# Patient Record
Sex: Male | Born: 1965 | Race: White | Hispanic: No | Marital: Married | Smoking: Current every day smoker
Health system: Southern US, Community
[De-identification: ages and names within clinical notes are randomized; demographics above are authoritative.]

## PROBLEM LIST (undated history)

## (undated) DIAGNOSIS — E785 Hyperlipidemia, unspecified: Secondary | ICD-10-CM

## (undated) DIAGNOSIS — Z87898 Personal history of other specified conditions: Secondary | ICD-10-CM

## (undated) DIAGNOSIS — N529 Male erectile dysfunction, unspecified: Secondary | ICD-10-CM

## (undated) DIAGNOSIS — E7211 Homocystinuria: Secondary | ICD-10-CM

## (undated) DIAGNOSIS — H5702 Anisocoria: Secondary | ICD-10-CM

## (undated) DIAGNOSIS — D3A00 Benign carcinoid tumor of unspecified site: Secondary | ICD-10-CM

## (undated) DIAGNOSIS — I10 Essential (primary) hypertension: Secondary | ICD-10-CM

## (undated) DIAGNOSIS — I4510 Unspecified right bundle-branch block: Secondary | ICD-10-CM

## (undated) DIAGNOSIS — K219 Gastro-esophageal reflux disease without esophagitis: Secondary | ICD-10-CM

## (undated) DIAGNOSIS — J988 Other specified respiratory disorders: Secondary | ICD-10-CM

## (undated) DIAGNOSIS — R7989 Other specified abnormal findings of blood chemistry: Secondary | ICD-10-CM

## (undated) HISTORY — PX: OTHER SURGICAL HISTORY: SHX169

## (undated) HISTORY — DX: Hyperlipidemia, unspecified: E78.5

## (undated) HISTORY — DX: Other specified abnormal findings of blood chemistry: R79.89

## (undated) HISTORY — PX: INGUINAL HERNIA REPAIR: SUR1180

## (undated) HISTORY — DX: Gastro-esophageal reflux disease without esophagitis: K21.9

## (undated) HISTORY — DX: Essential (primary) hypertension: I10

## (undated) HISTORY — DX: Male erectile dysfunction, unspecified: N52.9

## (undated) HISTORY — DX: Other specified respiratory disorders: J98.8

## (undated) HISTORY — DX: Benign carcinoid tumor of unspecified site: D3A.00

## (undated) HISTORY — DX: Unspecified right bundle-branch block: I45.10

## (undated) HISTORY — DX: Personal history of other specified conditions: Z87.898

## (undated) HISTORY — DX: Homocystinuria: E72.11

## (undated) HISTORY — DX: Anisocoria: H57.02

---

## 2001-09-30 DIAGNOSIS — D3A00 Benign carcinoid tumor of unspecified site: Secondary | ICD-10-CM

## 2001-09-30 HISTORY — PX: OTHER SURGICAL HISTORY: SHX169

## 2001-09-30 HISTORY — DX: Benign carcinoid tumor of unspecified site: D3A.00

## 2002-07-16 ENCOUNTER — Encounter: Payer: Self-pay | Admitting: Internal Medicine

## 2002-07-16 ENCOUNTER — Encounter: Admission: RE | Admit: 2002-07-16 | Discharge: 2002-07-16 | Payer: Self-pay | Admitting: Internal Medicine

## 2002-07-18 ENCOUNTER — Encounter: Payer: Self-pay | Admitting: Family Medicine

## 2002-07-18 ENCOUNTER — Encounter: Admission: RE | Admit: 2002-07-18 | Discharge: 2002-07-18 | Payer: Self-pay | Admitting: Family Medicine

## 2003-01-17 ENCOUNTER — Encounter: Admission: RE | Admit: 2003-01-17 | Discharge: 2003-01-17 | Payer: Self-pay | Admitting: Internal Medicine

## 2003-01-17 ENCOUNTER — Encounter: Payer: Self-pay | Admitting: Internal Medicine

## 2003-07-05 ENCOUNTER — Encounter: Payer: Self-pay | Admitting: Internal Medicine

## 2003-07-05 ENCOUNTER — Encounter: Admission: RE | Admit: 2003-07-05 | Discharge: 2003-07-05 | Payer: Self-pay | Admitting: Internal Medicine

## 2004-08-07 ENCOUNTER — Ambulatory Visit: Payer: Self-pay | Admitting: Internal Medicine

## 2004-09-17 ENCOUNTER — Ambulatory Visit: Payer: Self-pay | Admitting: Internal Medicine

## 2004-09-17 ENCOUNTER — Observation Stay (HOSPITAL_COMMUNITY): Admission: EM | Admit: 2004-09-17 | Discharge: 2004-09-18 | Payer: Self-pay | Admitting: Emergency Medicine

## 2004-09-26 ENCOUNTER — Ambulatory Visit: Payer: Self-pay

## 2004-10-11 ENCOUNTER — Ambulatory Visit: Payer: Self-pay

## 2004-10-19 ENCOUNTER — Ambulatory Visit: Payer: Self-pay | Admitting: Internal Medicine

## 2005-01-04 ENCOUNTER — Ambulatory Visit: Payer: Self-pay | Admitting: Internal Medicine

## 2005-07-01 ENCOUNTER — Ambulatory Visit: Payer: Self-pay | Admitting: Internal Medicine

## 2005-07-19 ENCOUNTER — Ambulatory Visit: Payer: Self-pay | Admitting: Internal Medicine

## 2005-09-30 DIAGNOSIS — H5702 Anisocoria: Secondary | ICD-10-CM

## 2005-09-30 HISTORY — DX: Anisocoria: H57.02

## 2005-12-18 ENCOUNTER — Ambulatory Visit: Payer: Self-pay | Admitting: Internal Medicine

## 2006-01-13 ENCOUNTER — Ambulatory Visit: Payer: Self-pay | Admitting: Internal Medicine

## 2006-03-27 ENCOUNTER — Ambulatory Visit: Payer: Self-pay | Admitting: Internal Medicine

## 2006-04-25 ENCOUNTER — Ambulatory Visit: Payer: Self-pay | Admitting: Internal Medicine

## 2006-05-07 ENCOUNTER — Ambulatory Visit: Payer: Self-pay | Admitting: Internal Medicine

## 2006-07-15 ENCOUNTER — Ambulatory Visit: Payer: Self-pay | Admitting: Internal Medicine

## 2006-07-23 ENCOUNTER — Ambulatory Visit: Payer: Self-pay | Admitting: Internal Medicine

## 2006-08-06 ENCOUNTER — Ambulatory Visit: Payer: Self-pay | Admitting: Internal Medicine

## 2006-09-10 ENCOUNTER — Ambulatory Visit: Payer: Self-pay | Admitting: Internal Medicine

## 2006-09-10 LAB — CONVERTED CEMR LAB
ALT: 28 units/L (ref 0–40)
AST: 24 units/L (ref 0–37)
Chol/HDL Ratio, serum: 2.9
Cholesterol: 176 mg/dL (ref 0–200)
HDL: 60.7 mg/dL (ref >39.0)
LDL Cholesterol: 106 mg/dL — ABNORMAL HIGH (ref 0–99)
Triglyceride fasting, serum: 49 mg/dL (ref 0–149)
VLDL: 10 mg/dL (ref 0–40)

## 2006-10-03 ENCOUNTER — Ambulatory Visit: Payer: Self-pay | Admitting: Internal Medicine

## 2006-11-04 DIAGNOSIS — J45909 Unspecified asthma, uncomplicated: Secondary | ICD-10-CM

## 2007-12-14 ENCOUNTER — Ambulatory Visit: Payer: Self-pay | Admitting: Internal Medicine

## 2007-12-14 DIAGNOSIS — K219 Gastro-esophageal reflux disease without esophagitis: Secondary | ICD-10-CM

## 2007-12-14 DIAGNOSIS — E785 Hyperlipidemia, unspecified: Secondary | ICD-10-CM

## 2007-12-14 DIAGNOSIS — M5430 Sciatica, unspecified side: Secondary | ICD-10-CM

## 2008-02-24 ENCOUNTER — Encounter (INDEPENDENT_AMBULATORY_CARE_PROVIDER_SITE_OTHER): Payer: Self-pay | Admitting: *Deleted

## 2008-02-24 ENCOUNTER — Ambulatory Visit: Payer: Self-pay | Admitting: Internal Medicine

## 2008-10-27 ENCOUNTER — Ambulatory Visit: Payer: Self-pay | Admitting: Internal Medicine

## 2008-10-27 DIAGNOSIS — F528 Other sexual dysfunction not due to a substance or known physiological condition: Secondary | ICD-10-CM | POA: Insufficient documentation

## 2008-10-27 DIAGNOSIS — R079 Chest pain, unspecified: Secondary | ICD-10-CM

## 2008-10-27 DIAGNOSIS — F329 Major depressive disorder, single episode, unspecified: Secondary | ICD-10-CM

## 2008-10-27 DIAGNOSIS — D3A025 Benign carcinoid tumor of the sigmoid colon: Secondary | ICD-10-CM

## 2008-10-28 ENCOUNTER — Encounter: Payer: Self-pay | Admitting: Internal Medicine

## 2008-11-25 ENCOUNTER — Ambulatory Visit: Payer: Self-pay | Admitting: Internal Medicine

## 2008-11-29 ENCOUNTER — Encounter (INDEPENDENT_AMBULATORY_CARE_PROVIDER_SITE_OTHER): Payer: Self-pay | Admitting: *Deleted

## 2008-12-05 LAB — CONVERTED CEMR LAB
BUN: 12 mg/dL (ref 6–23)
Creatinine, Ser: 0.9 mg/dL (ref 0.4–1.5)
Hgb A1c MFr Bld: 5.3 % (ref 4.6–6.0)
Potassium: 3.9 meq/L (ref 3.5–5.1)
TSH: 2.19 microintl units/mL (ref 0.35–5.50)
Testosterone: 271.32 ng/dL — ABNORMAL LOW (ref 350.00–890.00)

## 2008-12-06 ENCOUNTER — Encounter (INDEPENDENT_AMBULATORY_CARE_PROVIDER_SITE_OTHER): Payer: Self-pay | Admitting: *Deleted

## 2009-02-21 ENCOUNTER — Telehealth (INDEPENDENT_AMBULATORY_CARE_PROVIDER_SITE_OTHER): Payer: Self-pay | Admitting: *Deleted

## 2009-04-24 ENCOUNTER — Ambulatory Visit: Payer: Self-pay | Admitting: Internal Medicine

## 2009-04-24 DIAGNOSIS — E291 Testicular hypofunction: Secondary | ICD-10-CM

## 2009-05-18 ENCOUNTER — Telehealth (INDEPENDENT_AMBULATORY_CARE_PROVIDER_SITE_OTHER): Payer: Self-pay | Admitting: *Deleted

## 2009-10-24 ENCOUNTER — Ambulatory Visit: Payer: Self-pay | Admitting: Internal Medicine

## 2009-10-24 DIAGNOSIS — I10 Essential (primary) hypertension: Secondary | ICD-10-CM | POA: Insufficient documentation

## 2009-10-24 DIAGNOSIS — R21 Rash and other nonspecific skin eruption: Secondary | ICD-10-CM

## 2009-11-28 ENCOUNTER — Telehealth: Payer: Self-pay | Admitting: Internal Medicine

## 2010-05-08 ENCOUNTER — Inpatient Hospital Stay (HOSPITAL_COMMUNITY): Admission: EM | Admit: 2010-05-08 | Discharge: 2010-05-10 | Payer: Self-pay | Admitting: Emergency Medicine

## 2010-05-10 ENCOUNTER — Encounter: Payer: Self-pay | Admitting: Internal Medicine

## 2010-09-25 ENCOUNTER — Telehealth: Payer: Self-pay | Admitting: Internal Medicine

## 2010-10-30 NOTE — Letter (Signed)
Summary: Encounter Notice/[facility]  Encounter Notice/[facility]   Imported By: Lanelle Bal 05/17/2010 09:29:59  _____________________________________________________________________  External Attachment:    Type:   Image     Comment:   External Document

## 2010-10-30 NOTE — Assessment & Plan Note (Signed)
Summary: chicken pox/kdc   Vital Signs:  Patient profile:   45 year old Morrow Weight:      211.6 pounds Temp:     98.1 degrees F oral Pulse rate:   76 / minute Resp:     15 per minute BP sitting:   160 / 88  (left arm) Cuff size:   large  Vitals Entered By: Shonna Chock (October 24, 2009 2:03 PM) CC: 1.) Blisters- one on left hand, one in head   2.)Discuss Lisinopril-patient not taking, ? if he ever took  3.) Orders for fasting labs-near future Comments REVIEWED MED LIST, PATIENT AGREED DOSE AND INSTRUCTION CORRECT    CC:  1.) Blisters- one on left hand, one in head   2.)Discuss Lisinopril-patient not taking, and ? if he ever took  3.) Orders for fasting labs-near future.  History of Present Illness: 2 asymptomatic  blisters as 10/20/2009 noted while typing; no tauma, chemical exposure or burning.Resolving w/o treatment except topical neosporin. No PMH of herpes or chickenpox.See BP ; BP not checked @ home. He has not been on Lisinopril as listed .He is on CCB.  Allergies (verified): No Known Drug Allergies  Review of Systems General:  Complains of chills; denies fever, sweats, and weight loss. ENT:  Denies sore throat; No oral lesions. GU:  Denies discharge, dysuria, genital sores, and hematuria. MS:  Complains of joint pain; R ankle pain ,? gout. Derm:  Complains of lesion(s); denies itching.  Physical Exam  General:  well-nourished,in no acute distress; alert,appropriate and cooperative throughout examination Mouth:  Oral mucosa and oropharynx without lesions or exudates.  Teeth in good repair. Lungs:  Normal respiratory effort, chest expands symmetrically. Lungs are clear to auscultation, no crackles or wheezes. Heart:  normal rate, regular rhythm, no gallop, no rub, no JVD, no HJR, and grade 1.5 /6 systolic murmur.   Abdomen:  Bowel sounds positive,abdomen soft and non-tender without masses, organomegaly or hernias noted. Genitalia:   No penis lesions or urethral  discharge. Skin:  2 vesicles L haand 5X5 mm & 7X7 mm  Cervical Nodes:  No lymphadenopathy noted Axillary Nodes:  No palpable lymphadenopathy; no epitrochlear LA   Impression & Recommendations:  Problem # 1:  SKIN RASH (ICD-782.1) burn suggested ; resolving   Problem # 2:  HYPERTENSION (ICD-401.9)  His updated medication list for this problem includes:    Cardizem La 240 Mg Tb24 (Diltiazem hcl coated beads) .Marland Kitchen... Take 1 tablet by mouth once a day    Lisinopril 10 Mg Tabs (Lisinopril) .Marland Kitchen... 1 once daily  Complete Medication List: 1)  Cardizem La 240 Mg Tb24 (Diltiazem hcl coated beads) .... Take 1 tablet by mouth once a day 2)  Omeprazole 20 Mg Tbec (Omeprazole) .... Take 1 tab once daily 3)  Ultram 50 Mg Tabs (Tramadol hcl) .Marland Kitchen.. 1 -2 q 6 hrs as needed pain 4)  Lisinopril 10 Mg Tabs (Lisinopril) .Marland Kitchen.. 1 once daily 5)  Viagra 100 Mg Tabs (Sildenafil citrate) .... 1/2 -1 once daily as needed  Patient Instructions: 1)  Zinc oxide to vesicles.Stop Neosporin. 2)  Check your Blood Pressure regularly. If it is above:135/85 ON AVERAGE  you should make an appointment. Prescriptions: CARDIZEM LA 240 MG TB24 (DILTIAZEM HCL COATED BEADS) Take 1 tablet by mouth once a day  #90.0 Each x 1   Entered and Authorized by:   Marga Melnick MD   Signed by:   Marga Melnick MD on 10/24/2009   Method used:   Arneta Cliche  to ...       [facility] [address] # [phone]* (retail)       [address]       [place], [place]  [address]       Ph: [phone]       Fax: [phone]   RxID:   [id number] LISINOPRIL 10 MG TABS (LISINOPRIL) 1 once daily  #90 x 1   Entered and Authorized by:   Marga Melnick MD   Signed by:   Marga Melnick MD on 10/24/2009   Method used:   Faxed to ...       [facility] [address] # [phone]* (retail)       [address]       [place], [place]  [address]       Ph: [phone]       Fax: [phone]   RxID:   [id number]

## 2010-10-30 NOTE — Progress Notes (Signed)
Summary: med d/c swelling in lip  Phone Note Call from Patient Call back at Home Phone [phone] Call back at Work Phone [phone]   Caller: Patient Summary of Call: Pt states that he was seen on yesterday at [facility] for swelling in his lip and was given a shot of prednisone.pt was told this was a reaction to his lisinopril so he was told to d/c this med. pt states that most of the swelling has gone down just a little puffiness remain. pt uses [facility] [address] dr Keyonna Comunale pls advise.........Marland KitchenFelecia Deloach CMA  November 28, 2009 11:05 AM   Follow-up for Phone Call        Per dr Fahim Kats pt to monitor BP off lisinopril. BP goal =<135/85 on average. pt advise to  call for OV if >above average over 5-7day period. Pt aware...................Marland KitchenFelecia Deloach CMA  November 28, 2009 12:25 PM     New Allergies: ! LISINOPRIL New Allergies: ! LISINOPRIL

## 2010-11-01 NOTE — Progress Notes (Signed)
Summary: Triage call--rx request  Phone Note Call from Patient   Summary of Call: Patient called c/o flu like symptoms and nausea. Patient would like prescription sent in for flu and nausea.  He was made aware that we need office visit for assessment and offered appt for tomorrow or he could go to urgent care. Patient declined appt stating that he needs something now and will just check with [facility]. Initial call taken by: Lucious Groves CMA,  September 25, 2010 2:27 PM

## 2010-12-14 LAB — APTT: aPTT: 34 seconds (ref 24–37)

## 2010-12-14 LAB — COMPREHENSIVE METABOLIC PANEL
ALT: 45 U/L (ref 0–53)
AST: 47 U/L — ABNORMAL HIGH (ref 0–37)
Albumin: 3.2 g/dL — ABNORMAL LOW (ref 3.5–5.2)
CO2: 26 mEq/L (ref 19–32)
Calcium: 8.6 mg/dL (ref 8.4–10.5)
Calcium: 9.4 mg/dL (ref 8.4–10.5)
Chloride: 99 mEq/L (ref 96–112)
Creatinine, Ser: 0.85 mg/dL (ref 0.4–1.5)
Creatinine, Ser: 0.87 mg/dL (ref 0.4–1.5)
GFR calc Af Amer: >60 mL/min (ref >60)
GFR calc non Af Amer: >60 mL/min (ref >60)
Glucose, Bld: 100 mg/dL — ABNORMAL HIGH (ref 70–99)
Sodium: 136 mEq/L (ref 135–145)
Total Bilirubin: 0.7 mg/dL (ref 0.3–1.2)
Total Protein: 6.2 g/dL (ref 6.0–8.3)

## 2010-12-14 LAB — LACTIC ACID, PLASMA: Lactic Acid, Venous: 1.5 mmol/L (ref 0.5–2.2)

## 2010-12-14 LAB — CBC
HCT: 42.5 % (ref 39.0–52.0)
HCT: 42.7 % (ref 39.0–52.0)
MCH: 30.2 pg (ref 26.0–34.0)
MCHC: 33.6 g/dL (ref 30.0–36.0)
MCHC: 34 g/dL (ref 30.0–36.0)
MCV: 89.3 fL (ref 78.0–100.0)
Platelets: 204 10*3/uL (ref 150–400)
Platelets: 218 10*3/uL (ref 150–400)
RBC: 4.44 MIL/uL (ref 4.22–5.81)
RDW: 12.9 % (ref 11.5–15.5)
RDW: 13.1 % (ref 11.5–15.5)
WBC: 8.7 10*3/uL (ref 4.0–10.5)

## 2010-12-14 LAB — VALPROIC ACID LEVEL: Valproic Acid Lvl: <10 ug/mL — ABNORMAL LOW (ref 50.0–100.0)

## 2010-12-14 LAB — DIFFERENTIAL
Basophils Absolute: 0 10*3/uL (ref 0.0–0.1)
Basophils Relative: 0 % (ref 0–1)
Eosinophils Relative: 4 % (ref 0–5)
Lymphocytes Relative: 17 % (ref 12–46)
Lymphs Abs: 1.1 10*3/uL (ref 0.7–4.0)
Monocytes Absolute: 0.7 10*3/uL (ref 0.1–1.0)
Monocytes Relative: 7 % (ref 3–12)
Neutro Abs: 6.1 10*3/uL (ref 1.7–7.7)
Neutrophils Relative %: 70 % (ref 43–77)

## 2010-12-14 LAB — BASIC METABOLIC PANEL
BUN: 6 mg/dL (ref 6–23)
GFR calc non Af Amer: >60 mL/min (ref >60)
Potassium: 3.5 mEq/L (ref 3.5–5.1)

## 2010-12-14 LAB — PROTIME-INR: Prothrombin Time: 13 seconds (ref 11.6–15.2)

## 2010-12-14 LAB — FIBRINOGEN: Fibrinogen: 252 mg/dL (ref 204–475)

## 2010-12-20 ENCOUNTER — Other Ambulatory Visit: Payer: Self-pay | Admitting: Internal Medicine

## 2011-01-25 ENCOUNTER — Telehealth: Payer: Self-pay | Admitting: Internal Medicine

## 2011-01-25 MED ORDER — DILTIAZEM HCL ER COATED BEADS 240 MG PO TB24: 240.0000 mg | ORAL_TABLET | Freq: Every day | ORAL | Status: DC

## 2011-01-25 NOTE — Telephone Encounter (Signed)
Patient has appointment for physical on Fri 02/15/2011 with Dr Alwyn Ren---  He says he will not have insurance as of Monday 4/30 so he needs a refill for his Cardizem called into [facility], [address] today ---he is OUT of medcations as of today----would like 90 day prescription instead of 30

## 2011-02-04 ENCOUNTER — Other Ambulatory Visit: Payer: Self-pay | Admitting: Internal Medicine

## 2011-02-15 ENCOUNTER — Encounter: Payer: Self-pay | Admitting: Internal Medicine

## 2011-02-15 ENCOUNTER — Ambulatory Visit (INDEPENDENT_AMBULATORY_CARE_PROVIDER_SITE_OTHER): Payer: BC Managed Care – PPO | Admitting: Internal Medicine

## 2011-02-15 VITALS — BP 150/94 | HR 80 | Temp 98.3°F | Resp 14 | Ht 73.5 in | Wt 203.8 lb

## 2011-02-15 DIAGNOSIS — E785 Hyperlipidemia, unspecified: Secondary | ICD-10-CM

## 2011-02-15 DIAGNOSIS — N400 Enlarged prostate without lower urinary tract symptoms: Secondary | ICD-10-CM

## 2011-02-15 DIAGNOSIS — Z0000 Encounter for general adult medical examination without abnormal findings: Secondary | ICD-10-CM

## 2011-02-15 DIAGNOSIS — Z23 Encounter for immunization: Secondary | ICD-10-CM

## 2011-02-15 DIAGNOSIS — I10 Essential (primary) hypertension: Secondary | ICD-10-CM

## 2011-02-15 LAB — CBC WITH DIFFERENTIAL/PLATELET
Basophils Absolute: 0.1 10*3/uL (ref 0.0–0.1)
Eosinophils Absolute: 0.3 10*3/uL (ref 0.0–0.7)
Eosinophils Relative: 4 % (ref 0–5)
HCT: 46.2 % (ref 39.0–52.0)
MCH: 30.7 pg (ref 26.0–34.0)
MCHC: 34.8 g/dL (ref 30.0–36.0)
MCV: 88 fL (ref 78.0–100.0)
Monocytes Absolute: 0.6 10*3/uL (ref 0.1–1.0)
Platelets: 289 10*3/uL (ref 150–400)
RDW: 13.2 % (ref 11.5–15.5)

## 2011-02-15 LAB — HEPATIC FUNCTION PANEL
ALT: 19 U/L (ref 0–53)
AST: 22 U/L (ref 0–37)
Alkaline Phosphatase: 75 U/L (ref 39–117)
Bilirubin, Direct: 0.1 mg/dL (ref 0.0–0.3)
Total Bilirubin: 0.4 mg/dL (ref 0.3–1.2)

## 2011-02-15 LAB — LIPID PANEL
Cholesterol: 219 mg/dL — ABNORMAL HIGH (ref 0–200)
VLDL: 30.4 mg/dL (ref 0.0–40.0)

## 2011-02-15 LAB — BASIC METABOLIC PANEL
BUN: 7 mg/dL (ref 6–23)
Calcium: 9.8 mg/dL (ref 8.4–10.5)
GFR: 124.89 mL/min (ref >60.00)
Potassium: 5 mEq/L (ref 3.5–5.1)

## 2011-02-15 LAB — PSA: PSA: 0.69 ng/mL (ref 0.10–4.00)

## 2011-02-15 MED ORDER — TETANUS-DIPHTH-ACELL PERTUSSIS 5-2.5-18.5 LF-MCG/0.5 IM SUSP
0.5000 mL | Freq: Once | INTRAMUSCULAR | Status: AC
  Administered 2011-02-15: 0.5 mL via INTRAMUSCULAR

## 2011-02-15 NOTE — Patient Instructions (Addendum)
Preventive Health Care: Exercise at least 30-45 minutes a day,  3-4 days a week.  Eat a low-fat diet with lots of fruits and vegetables, up to 7-9 servings per day. Avoid obesity; your goal is waist measurement < 40 inches.Consume less than 40 grams of sugar per day from foods & drinks with High Fructose Corn Sugar as #2,3 or # 4 on label. Seatbelts can save your life. Wear them always. Smoke detectors on every level of your home, check batteries every year. Alcohol If you drink, do it moderately,less than 9 drinks per week, preferably less than 6 @ most. Please think about quitting smoking. Review the risks we discussed. Please call 1-800-QUIT-NOW [phone]) for free smoking cessation counseling.Smoking cessation may provide excellent blood pressure control. If your blood pressure remains above 135/85 on average, a low-dose diuretic (HCTZ 12.5 mg) would be  added.

## 2011-02-15 NOTE — Progress Notes (Signed)
Subjective:    Patient ID: Samuel Morrow, male    DOB: [birth year], 45 y.o.   MRN: [id number]  HPI Samuel Morrow is here for a physical; he questions whether psychotropic meds from Dr Tomasa Rand could be reduced.    Review of Systems Patient reports no  vision/ hearing changes,anorexia, weight change, fever ,adenopathy, persistant / recurrent hoarseness, swallowing issues, chest pain,edema,persistant / recurrent cough, hemoptysis, dyspnea(rest, exertional, paroxysmal nocturnal), gastrointestinal  bleeding (melena, rectal bleeding), abdominal pain, excessive heart burn, GU symptoms( dysuria, hematuria, pyuria, voiding/incontinence  issues) syncope, focal weakness,numbness & tingling, skin/hair/nail changes, abnormal bruising/bleeding, or  musculoskeletal symptoms/signs. He describes some frontal headaches  Which are not significant. He denies a constellation of headache, flushing, chest pain, and diarrhea. He does have palpitations with anxiety despite his present medications. The symptoms are worse when he is in a crowd. He does not believe that depression is an active issue. Apparently Dr. Tomasa Rand has diagnosis of bipolar disorder.Stress at this time includes being separated from his wife. They're trying to pursue counseling. He questions some memory issues.    Objective:   Physical Exam Gen.: Healthy and well-nourished in appearance. Alert, appropriate and cooperative throughout exam. Head: Normocephalic without obvious abnormalities;  no alopecia . Beard &  Moustache present Eyes: No corneal or conjunctival inflammation noted. Pupils slightly unequal but reactive to light and accommodation. Fundal exam is benign without hemorrhages, exudate, papilledema. Extraocular motion intact. Vision grossly normal. Ears: External  ear exam reveals no significant lesions or deformities. Canals clear .TMs normal. Hearing is grossly normal bilaterally. Nose: External nasal exam reveals no deformity or inflammation.  Nasal mucosa are pink and moist. No lesions or exudates noted. Septum   Not deviated  Mouth: Oral mucosa and oropharynx reveal no lesions or exudates but pharynx is erythematous. Teeth in good repair. Neck: No deformities, masses, or tenderness noted. Range of motion & . Thyroid  normal. Lungs: Normal respiratory effort; chest expands symmetrically. Lungs are clear to auscultation without rales, wheezes, or increased work of breathing. Heart: Normal rate and rhythm. Normal S1 and S2. No gallop, click, or rub. Grade 1/2 systolic  Murmur @ R  base. Abdomen: Bowel sounds normal; abdomen soft and nontender. No masses, organomegaly or hernias noted. Genitalia: DRE revealed  prostate ULN; no scrotal or penile lesions present.                                                                                     Musculoskeletal/extremities: No deformity or scoliosis noted of  the thoracic or lumbar spine. No clubbing, cyanosis, edema, or deformity noted. Range of motion  normal .Tone & strength  normal.Joints normal. Nail health  good. Vascular: Carotid, radial artery, dorsalis pedis and dorsalis posterior tibial pulses are full and equal. No bruits present. Neurologic: Alert and oriented x3. Deep tendon reflexes symmetrical and normal.         Skin: Intact without suspicious lesions or rashes. Lymph: No cervical, axillary, or inguinal lymphadenopathy present. Psych: Mood and affect are normal. Normally interactive ; calm demeanor  Assessment & Plan:  #1 compress the physical exam; no acute issues  #2 hypertension, suboptimal control suggested  #3 dyslipidemia  #4 bipolar disorder, as per Dr. Tomasa Rand  Plan: If blood pressure control is suboptimal based on home blood pressure measurements, low-dose diuretic will be added. See orders and recommendations.

## 2011-02-15 NOTE — Discharge Summary (Signed)
Samuel Morrow, Samuel Morrow                ACCOUNT NO.:  [id number]   MEDICAL RECORD NO.:  [id number]          PATIENT TYPE:  INP   LOCATION:  6524                         FACILITY:  [facility]   PHYSICIAN:  Rene Paci, M.D. LHCDATE OF BIRTH:  1966-04-08   DATE OF ADMISSION:  09/17/2004  DATE OF DISCHARGE:  09/18/2004                                 DISCHARGE SUMMARY   DISCHARGE DIAGNOSES:  1.  Atypical chest pain.  2.  Gastroesophageal reflux disease.  3.  Esophageal spasm.   BRIEF ADMISSION HISTORY:  [name] is a 45 year old white male who  presented with substernal chest pain associated with shortness of breath and  left proximal leg pain.  He also described paraesthesias to his fifth finger  of both hands.  He had some visual distortions and some diarrhea, but no  vomiting.  He did have some radiation to the back.  The patient did have  some mild reoccurrence of his chest pain in the emergency department which  was relieved with nitroglycerin patch.   PAST MEDICAL HISTORY:  1.  Hypertension.  2.  Ongoing tobacco use.  3.  Asthma.  4.  Alcohol abuse.  5.  Hernia repair as a baby.   HOSPITAL COURSE:  #1 - ATYPICAL CHEST PAIN:  Patient was admitted to rule  out myocardial infarction.  Serial cardiac enzymes were negative.  EKG was  without ischemia.  We do not feel this is cardiac but we will schedule the  patient for an outpatient Cardiolite.  This is more likely GI secondary to  his known reflux disease and esophageal spasm.  We will have the patient  increase his Prevacid to b.i.d. and follow up with his primary care  physician.   #2 - PULMONARY:  As noted, the patient has ongoing tobacco use of one-half  pack to one pack per day.  The patient's D-dimer was elevated at 0.72.  This  prompted a CT chest.  This was negative for PE.  It was also negative for  pneumonia, congestive heart failure, or adenopathy.  Patient's O2  saturations were 98% on room air.   #3 - ALCOHOL  ABUSE:  Patient does admit to a 12-pack a day.  The patient has  not shown any signs of withdrawal during this admission, but should be  counseled regarding alcohol abuse as an outpatient.   DISCHARGE MEDICATIONS:  1.  Cardizem CD 240 mg daily.  2.  Prevacid 30 mg b.i.d.  3.  Advair b.i.d.   Will arrange outpatient Cardiolite and follow-up with Dr. Alwyn Ren.      Laur   LC/MEDQ  D:  09/18/2004  T:  09/18/2004  Job:  [id number]   cc:   Titus Dubin. Alwyn Ren, M.D. [facility]

## 2011-02-15 NOTE — Assessment & Plan Note (Signed)
[facility]                        [facility] OFFICE NOTE   GURVIR, SCHROM                         MRN:          [id number]  DATE:08/06/2006                            DOB:          02/23/1966    Lenny Pastel Ferd Glassing) has requested consultation with psychiatrist for  depression.   He has previously seen Dr. Evelene Croon. He has been on Wellbutrin in  combination with Lexapro in the past which resulted in angioedema. He  has also been on Celexa. Most recently, he has been titrated to a dose  of Effexor XR 75 two daily.   He states that prior to increasing the Effexor he would be happy for  one or two days and then experience fatigue. He felt that he was having  more lows than highs. He also described anxiety.   The family history is strongly positive for mental health issues.  Specifically, his maternal grandfather was an alcoholic. Mother may have  had mental health issues. A maternal aunt had manic depression and was  in a state hospital. Maternal grandmother had manic depression and  received shock therapy. Father and sister both had depression and  previously had been on Wellbutrin.   Ferd Glassing will drink up to six beers per night. He has smoked up to a pack  and a half per day in the past. Chantix has been prescribed to  facilitate smoking cessation. 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   A mood questionnaire test was completed June 28. This showed only four  positives, and this being only a minor problem. This mitigated against  manic depression but referral to a psychiatrist for definitive diagnosis  and treatment is certainly indicated. He had been encouraged to reduce  his alcohol intake, particularly while on the Effexor.   He describes exogenous stressors, particularly at work.   Psychiatric consultation will be pursued. A copy of this dictation will  be provided to  ensure continuity of care.     Titus Dubin. Alwyn Ren, MD,FACP,FCCP  Electronically Signed    [facility]/MedQ  DD: 08/26/2006  DT: 08/26/2006  Job #: [id number]

## 2011-02-15 NOTE — Assessment & Plan Note (Addendum)
NMR Lipoprofile 2006: LDL 147(1750/1046), HDL 63, TG 106. LDL goal =< 110. Father, smoker,  had MI ?  @  69. PGF ? MI

## 2011-02-15 NOTE — H&P (Signed)
NAMEREINER, LOEWEN NO.:  [id number]   MEDICAL RECORD NO.:  [id number]          PATIENT TYPE:  EMS   LOCATION:  MAJO                         FACILITY:  [facility]   PHYSICIAN:  Corwin Levins, M.D. LHCDATE OF BIRTH:  21-Nov-1965   DATE OF ADMISSION:  09/17/2004  DATE OF DISCHARGE:                                HISTORY & PHYSICAL   CHIEF COMPLAINT:  Chest pain for 3-4 hours earlier this afternoon, resolving  spontaneously, but recurrent at 4 p.m. in the ER, decreased with  nitroglycerin.   HISTORY OF PRESENT ILLNESS:  Mr. Withey is a 45 year old white male with  substernal chest pain with shortness of breath and questionable coincidental  left proximal leg pain and paresthesias to the 5th fingers of both hands,  and some visual distortions as well.  There is some nausea, but no vomiting.  There is some radiation to the back, no palpitations or syncope.  He does  take his daily Prevacid and seems very compliant and straightforward with  this.  He had some mild recurrence of this same discomfort at 4 p.m. for  several minutes alone in the ER, decreased with the nitroglycerin patch  promptly.   PAST MEDICAL HISTORY:   ILLNESSES:  1.  Hypertension.  2.  Chronic smoker for 18 years.  3.  Asthma.   SURGERY:  Status post hernia as a baby.   ALLERGIES:  No known drug allergies.   CURRENT MEDICATIONS:  1.  Cardizem CD, probably 240 mg, but dose unclear.  2.  Prevacid 30 mg p.o. daily.  3.  Advair 1 puff b.i.d., unclear dosing, which he really does not take very      often.   SOCIAL HISTORY:  Tobacco:  A half to 1 pack per day.  Alcohol:  A 12-pack  per day.  Married, 1 daughter, lives in [place] with the wife.   FAMILY HISTORY:  Father with MI at 45 and subsequent CABG.   REVIEW OF SYSTEMS:  Review of systems otherwise noncontributory except for  recent left foot cellulitis treated by urgent care.   PHYSICAL EXAMINATION:  GENERAL:  Mr. Stavely is a 45 year old  white male.  VITAL SIGNS:  Blood pressure 141/72, respirations 20, pulse 80, afebrile.  EENT:  Sclerae clear.  TMs clear.  Oropharynx benign.  NECK:  Neck without lymphadenopathy, JVD or thyromegaly.  CHEST:  No rales or wheezing.  CARDIAC:  Regular rate and rhythm with no murmur.  ABDOMEN:  Abdomen soft, nontender.  Positive bowel sounds.  EXTREMITIES:  No edema.  Left proximal thigh itself seems benign in  appearance.  Hips:  Full range of motion.   LABORATORIES:  ECG:  Sinus rhythm, no acute changes.   Chest CT:  No acute problems.  No pulmonary embolus.   CK-MB 2.4, troponin I less than 0.05.  Electrolytes within normal limits.  BUN and creatinine 7 and 1.1, glucose 88.  Hemoglobin 16.3.  D-dimer  slightly high at 0.72.   ASSESSMENT AND PLAN:  1.  Chest pain.  Suspect gastrointestinal, probable esophageal spasm, given  the history and improvement with nitroglycerin patch, but cannot rule      out cardiac etiology, given the multiple risk factors, no evidence of      pulmonary embolus on CT.  He needs to be admitted.  Will check serial      cardiac enzymes and telemetry.  If negative, probably okay to follow up      with Dr. Titus Dubin. Hopper for outpatient Cardiolite.  2.  Visual disturbances and paresthesias.  I suspect mild anxiety reaction      due to the chest discomfort.  3.  Alcohol:  Twelve-pack per day use on a chronic basis possible etiology      for chest pain, as above.  We will need to watch withdrawal and is      encouraged to reduce or quit alcohol.  4.  Tobacco.  Try the Nicotine patch and need to encourage to quit as well.       JWJ/MEDQ  D:  09/17/2004  T:  09/18/2004  Job:  [id number]   cc:   Titus Dubin. Alwyn Ren, M.D. [facility]

## 2011-02-17 ENCOUNTER — Encounter: Payer: Self-pay | Admitting: Internal Medicine

## 2011-02-18 ENCOUNTER — Other Ambulatory Visit: Payer: Self-pay

## 2011-02-18 MED ORDER — PRAVASTATIN SODIUM 40 MG PO TABS: 40.0000 mg | ORAL_TABLET | Freq: Every day | ORAL | Status: DC

## 2011-02-26 ENCOUNTER — Other Ambulatory Visit: Payer: Self-pay | Admitting: Internal Medicine

## 2011-02-28 ENCOUNTER — Encounter: Payer: Self-pay | Admitting: Internal Medicine

## 2011-05-28 ENCOUNTER — Other Ambulatory Visit: Payer: Self-pay | Admitting: Internal Medicine

## 2011-07-18 ENCOUNTER — Telehealth: Payer: Self-pay | Admitting: *Deleted

## 2011-07-18 HISTORY — PX: OTHER SURGICAL HISTORY: SHX169

## 2011-07-18 NOTE — Telephone Encounter (Signed)
Pt called this am while system was down stated that he started having chest pain that was sharp on yesterday and is starting to get progressively worse. Pt sounded as if he didn't feel well so pt was instructed to go to ER now. Pt ok'd information and says he was going to the [facility] in [place].

## 2011-07-24 NOTE — Telephone Encounter (Signed)
He was seen & had  catheterization at [facility]; those records have been reviewed.

## 2011-08-13 ENCOUNTER — Encounter: Payer: Self-pay | Admitting: Internal Medicine

## 2011-08-14 ENCOUNTER — Encounter: Payer: Self-pay | Admitting: Internal Medicine

## 2011-08-14 ENCOUNTER — Ambulatory Visit (INDEPENDENT_AMBULATORY_CARE_PROVIDER_SITE_OTHER): Payer: BC Managed Care – PPO | Admitting: Internal Medicine

## 2011-08-14 VITALS — BP 140/90 | HR 91 | Temp 98.3°F | Ht 73.0 in | Wt 199.0 lb

## 2011-08-14 DIAGNOSIS — I2510 Atherosclerotic heart disease of native coronary artery without angina pectoris: Secondary | ICD-10-CM

## 2011-08-14 DIAGNOSIS — T783XXA Angioneurotic edema, initial encounter: Secondary | ICD-10-CM

## 2011-08-14 DIAGNOSIS — F17200 Nicotine dependence, unspecified, uncomplicated: Secondary | ICD-10-CM

## 2011-08-14 DIAGNOSIS — I10 Essential (primary) hypertension: Secondary | ICD-10-CM

## 2011-08-14 MED ORDER — AMLODIPINE BESYLATE 5 MG PO TABS: ORAL_TABLET | ORAL | Status: DC

## 2011-08-14 NOTE — Progress Notes (Signed)
  Subjective:    Patient ID: Samuel Morrow, male    DOB: 03-12-1966, 45 y.o.   MRN: [id number]  HPI Samuel Morrow had stenting of the right coronary artery 07/18/11 by Dr. Chales Abrahams at [facility]. Since discharge he's had some chest discomfort which he attributes to anxiety. He's been seen at the hospital twice since his stenting,once  for chest pain  & once for  shortness of breath. He was not admitted with the dyspnea. He  has decreased his cigarette consumption to 6 per day.  He has a past history of swelling of the lip with lisinopril. His discharge medications include Altace 10 mg daily. Since the cardiac stenting he has had some swelling of the lip on 2 occasions.       Review of Systems     Objective:   Physical Exam He appears healthy and well-nourished.  Chest is clear with no increased work of breathing, rales, rhonchi, or wheezes.  He has an S4 with a grade 1/2-1 systolic murmur at the apex. Rhythm is regular without premature beats.  Abdomen reveals normal bowel sounds; no organomegaly or tenderness. No renal artery bruits or aortic aneurysm present.  All pulses are intact without deficit.  He has no edema, clubbing, or cyanosis.  He is alert and oriented. His demeanor does not suggest anxiety.        Assessment & Plan:  #1 coronary artery disease; status post stenting right coronary artery  #2 recurrent angioedema. He is presently on ACE inhibitor which is contraindicated. Additionally he should not take angiotensin receptor blockers. I have added angiotensin receptor blockers as potential allergies as well because of the known potential  cross reaction between ACE inhibitors and angiotensin receptor blockers for angioedema  #3 hypertension, suboptimal control. Altace will be discontinued because of angioedema and amlodipine substituted.  #4 smoker, active. He is making an attempt to discontinue smoking totally. Risks were discussed.

## 2011-08-14 NOTE — Patient Instructions (Addendum)
Consider  [facility]'s smoking cessation program @ www.[employer].com or (437) 636-1624.   Please think about quitting smoking. Review the risks we discussed. Please call 1-800-QUIT-NOW  for free smoking cessation counseling.  Blood Pressure Goal  Ideally is an AVERAGE < 135/85. This AVERAGE should be calculated from @ least 5-7 BP readings taken @ different times of day on different days of week. You should not respond to isolated BP readings , but rather the AVERAGE for that week  Please  schedule fasting Labs in early 10/2011 (after 10 weeks of Lipitor) : CK,Lipids, hepatic panel.  Please bring these instructions to that Lab appt.     Because of the angioedema or swelling of lips; you cannot take blood pressure medicines in the ACE inhibitor or angiotensin receptor blocker class.

## 2011-09-18 ENCOUNTER — Telehealth: Payer: Self-pay | Admitting: *Deleted

## 2011-09-18 NOTE — Telephone Encounter (Signed)
Amlodipine should be increased to 5 mg twice a day; office visit if  his blood pressure averages greater than 135/85 with this increase.

## 2011-09-18 NOTE — Telephone Encounter (Signed)
Pt reports he had MI 10.18.12, was seen in office 11.14.12, where his hypertension medication had to be changed due to contraindications noted to Amlodipine. Since the change, Pt c/o elevated BP, [180/116 today] averaging 160/110. Informed Pt we would call back with recommendations from MD [Rx or OV].

## 2011-09-19 NOTE — Telephone Encounter (Signed)
LMOM to Inform patient. 

## 2011-09-25 ENCOUNTER — Telehealth: Payer: Self-pay | Admitting: Internal Medicine

## 2011-09-25 NOTE — Telephone Encounter (Signed)
Pt reports that he has never met goal of <135/85 and is averaging around 147/110 [his reading this AM]. Pt reports that he is having some anxiety [feels it may be coming from holidays and fact of MI x2 mths ago]. Pt started Amlodipine on 11.14.12 but does not feel that it is working.  Please advise.

## 2011-09-25 NOTE — Telephone Encounter (Signed)
Patient states that we changed amlodipine last week to 5mg  but feels like that is not working. Patient states that his blood pressure was 147/110 this morning.

## 2011-09-25 NOTE — Telephone Encounter (Signed)
He needs an office visit with his blood pressure diary, meds, and blood pressure cuff.

## 2011-09-25 NOTE — Telephone Encounter (Signed)
Patient informed; refused 12.27.12 appointments at 8:30a [w/Dr. Tabori] and 4:30pm [w/Dr. Hopper] due to work. Scheduled 12.28.12 at 10:00am per patient request.

## 2011-09-25 NOTE — Telephone Encounter (Signed)
Also informed patient that if any associated symptoms begin [i.e. Lightheaded, dizzy, blurred vision, chest and/or arm pain, H/A, nausea and/or vomiting] that he needs to report to UC/Ed for A&E.

## 2011-09-27 ENCOUNTER — Ambulatory Visit (INDEPENDENT_AMBULATORY_CARE_PROVIDER_SITE_OTHER): Payer: BC Managed Care – PPO | Admitting: Internal Medicine

## 2011-09-27 ENCOUNTER — Encounter: Payer: Self-pay | Admitting: Internal Medicine

## 2011-09-27 DIAGNOSIS — I10 Essential (primary) hypertension: Secondary | ICD-10-CM

## 2011-09-27 DIAGNOSIS — F17200 Nicotine dependence, unspecified, uncomplicated: Secondary | ICD-10-CM

## 2011-09-27 MED ORDER — AMLODIPINE BESYLATE 10 MG PO TABS: 10.0000 mg | ORAL_TABLET | Freq: Every day | ORAL | Status: DC

## 2011-09-27 MED ORDER — CLONIDINE HCL 0.1 MG PO TABS: 0.1000 mg | ORAL_TABLET | Freq: Two times a day (BID) | ORAL | Status: DC

## 2011-09-27 NOTE — Progress Notes (Signed)
  Subjective:    Patient ID: Samuel Morrow, male    DOB: [birth year], 45 y.o.   MRN: [id number]  HPI UNCONTROLLED  HYPERTENSION: Disease Monitoring  Blood pressure range: 148/93-184/119. With his cuff 184/114; our cuff read 152/98.  Chest pain: yes, with "worry" , not exertion   Dyspnea: no   Claudication: no   Medication compliance: yes, dose increased by phone last week .Now Amlodipine 5 mg , two daily &  Metoprolol 50 mg twice a day Medication Side Effects  Lightheadedness: no   Urinary frequency: no   Edema: yes, no hematuria or pyuia     Preventitive Healthcare:  Exercise: no   Diet Pattern: no plan  Salt Restriction: no added salt Now smoking 1 ppd or less      Review of Systems he denies a syndrome of headache, chest pain, flushing, and diarrhea.     Objective:   Physical Exam Gen.:  well-nourished in appearance. Alert, appropriate and cooperative throughout exam. Eyes: No corneal or conjunctival inflammation noted. Pupils equal round reactive to light and accommodation. Fundal exam is benign without hemorrhages, exudate, papilledema.MINIMAL arteriolar narrowing Neck: No deformities, masses, or tenderness noted.  Thyroid normal. Lungs: Normal respiratory effort; chest expands symmetrically. Lungs are clear to auscultation without rales, wheezes, or increased work of breathing. Heart: Normal rate and rhythm. Normal S1 and S2. No gallop, click, or rub. S 4  w/o murmur. Abdomen: Bowel sounds normal; abdomen soft and nontender. No masses, organomegaly or hernias noted.no AAA or  Bruits.                                                                                   Musculoskeletal/extremities:  No clubbing, cyanosis, edema, or deformity noted. Range of motion  normal .Nail health  good. Vascular: Carotid, radial artery, dorsalis pedis and  posterior tibial pulses are full and equal. No bruits present. Neurologic: Alert and oriented x3. Deep tendon reflexes symmetrical and  normal.          Skin: Intact without suspicious lesions or rashes. Psych: Mood and affect are slightly flat but normally interactive                                                                                         Assessment & Plan:

## 2011-09-27 NOTE — Patient Instructions (Signed)
Your BP goal = AVERAGE < 135/85. Avoid ingestion of  excess salt/sodium.Cook with pepper & other spices . Use the salt substitute "No Salt"(unless your potassium has been elevated) OR the Mrs Sharilyn Sites products to season food @ the table. Avoid foods which taste salty or "vinegary" as their sodium contentet will be high.  Please think about quitting smoking. Review the risks we discussed. Please call 1-800-QUIT-NOW  for free smoking cessation counseling.

## 2011-09-27 NOTE — Assessment & Plan Note (Signed)
Blood pressure is suboptimally controlled, but his blood pressure cuff may be recording at higher than true values. A fasting to check it against the blood pressure cuff at the pharmacy.  He is not a candidate for ACE and inhibitors or ARB agents because of past history of angioedema.  Clonidine will be added and titrated based on blood pressure readings.  24 urine for catecholamines and metanephrines will be also be checked. Clinically is no evidence for renal artery stenosis.

## 2011-10-05 LAB — CATECHOLAMINES, FRACTIONATED, URINE, 24 HOUR
Creatinine, Urine mg/day-CATEUR: 0.92 g/(24.h) (ref 0.63–2.50)
Dopamine, 24 hr Urine: 137 mcg/24 h (ref 52–480)

## 2011-10-05 LAB — METANEPHRINES, URINE, 24 HOUR
Metaneph Total, Ur: 661 mcg/24 h (ref 182–739)
Normetanephrine, 24H Ur: 359 mcg/24 h (ref 88–649)

## 2011-11-05 ENCOUNTER — Telehealth: Payer: Self-pay | Admitting: Internal Medicine

## 2011-11-05 ENCOUNTER — Other Ambulatory Visit: Payer: Self-pay | Admitting: Internal Medicine

## 2011-11-05 DIAGNOSIS — I10 Essential (primary) hypertension: Secondary | ICD-10-CM

## 2011-11-05 MED ORDER — AMLODIPINE BESYLATE 10 MG PO TABS: 10.0000 mg | ORAL_TABLET | Freq: Every day | ORAL | Status: DC

## 2011-11-05 NOTE — Telephone Encounter (Signed)
I called the pharmacy and the indicated patient mis-understood them, they were waiting on clarification from our office about 5 mg vs 10 mg (we had not hear from pharmacy by fax or phone), pharmacy was instructed patient was changed in December to 10 mg per MD. Pharmacist ok'd information

## 2011-11-05 NOTE — Telephone Encounter (Signed)
Duplicate request, rx filled earlier

## 2011-11-05 NOTE — Telephone Encounter (Signed)
Patient states that he called pharmacy to get fill the new prescription, Amlodipine and the pharmacy told him that they lost the prescription. Please send new rx to [facility] on [address] In [place].

## 2012-02-11 ENCOUNTER — Other Ambulatory Visit: Payer: Self-pay | Admitting: Internal Medicine

## 2012-02-20 ENCOUNTER — Other Ambulatory Visit: Payer: Self-pay | Admitting: Internal Medicine

## 2012-02-20 NOTE — Telephone Encounter (Signed)
#  1 , R X 2 

## 2012-02-20 NOTE — Telephone Encounter (Signed)
Dr.Hopper please advise, this medication is not on patient's med list I reviewed Epic and [facility]

## 2012-04-01 ENCOUNTER — Other Ambulatory Visit: Payer: Self-pay | Admitting: Internal Medicine

## 2012-05-05 ENCOUNTER — Other Ambulatory Visit: Payer: Self-pay | Admitting: Internal Medicine

## 2012-05-12 ENCOUNTER — Other Ambulatory Visit: Payer: Self-pay | Admitting: Internal Medicine

## 2012-05-28 ENCOUNTER — Ambulatory Visit: Payer: BC Managed Care – PPO | Admitting: Internal Medicine

## 2012-06-02 ENCOUNTER — Ambulatory Visit: Payer: BC Managed Care – PPO | Admitting: Internal Medicine

## 2012-06-02 DIAGNOSIS — Z0289 Encounter for other administrative examinations: Secondary | ICD-10-CM

## 2012-06-09 ENCOUNTER — Other Ambulatory Visit: Payer: Self-pay | Admitting: Internal Medicine

## 2012-07-03 ENCOUNTER — Other Ambulatory Visit: Payer: Self-pay | Admitting: Internal Medicine

## 2012-10-27 ENCOUNTER — Other Ambulatory Visit: Payer: Self-pay | Admitting: Internal Medicine

## 2012-11-30 ENCOUNTER — Other Ambulatory Visit: Payer: Self-pay | Admitting: Internal Medicine

## 2012-12-01 NOTE — Telephone Encounter (Signed)
Patient needs to schedule a medication management

## 2012-12-29 ENCOUNTER — Other Ambulatory Visit: Payer: Self-pay | Admitting: Internal Medicine

## 2012-12-30 NOTE — Telephone Encounter (Signed)
Schedule CPX 

## 2013-01-06 ENCOUNTER — Other Ambulatory Visit: Payer: Self-pay | Admitting: Internal Medicine

## 2013-01-07 NOTE — Telephone Encounter (Signed)
Patient needs to schedule Medication management f/u

## 2013-02-01 ENCOUNTER — Other Ambulatory Visit: Payer: Self-pay | Admitting: Internal Medicine

## 2013-02-02 NOTE — Telephone Encounter (Signed)
Schedule med management appointment now, CPX near future

## 2013-02-03 ENCOUNTER — Other Ambulatory Visit: Payer: Self-pay | Admitting: Internal Medicine

## 2013-02-10 ENCOUNTER — Other Ambulatory Visit: Payer: Self-pay | Admitting: Internal Medicine

## 2013-02-17 ENCOUNTER — Encounter: Payer: Self-pay | Admitting: Internal Medicine

## 2013-02-17 ENCOUNTER — Ambulatory Visit (INDEPENDENT_AMBULATORY_CARE_PROVIDER_SITE_OTHER): Payer: No Typology Code available for payment source | Admitting: Internal Medicine

## 2013-02-17 VITALS — BP 144/96 | HR 79 | Wt 201.0 lb

## 2013-02-17 DIAGNOSIS — I2510 Atherosclerotic heart disease of native coronary artery without angina pectoris: Secondary | ICD-10-CM | POA: Insufficient documentation

## 2013-02-17 DIAGNOSIS — I10 Essential (primary) hypertension: Secondary | ICD-10-CM

## 2013-02-17 DIAGNOSIS — D126 Benign neoplasm of colon, unspecified: Secondary | ICD-10-CM

## 2013-02-17 DIAGNOSIS — F17200 Nicotine dependence, unspecified, uncomplicated: Secondary | ICD-10-CM

## 2013-02-17 DIAGNOSIS — E785 Hyperlipidemia, unspecified: Secondary | ICD-10-CM

## 2013-02-17 DIAGNOSIS — K219 Gastro-esophageal reflux disease without esophagitis: Secondary | ICD-10-CM

## 2013-02-17 MED ORDER — OMEPRAZOLE 20 MG PO CPDR: DELAYED_RELEASE_CAPSULE | ORAL | Status: DC

## 2013-02-17 NOTE — Progress Notes (Signed)
  Subjective:    Patient ID: Samuel Morrow, male    DOB: 09-27-1966, 47 y.o.   MRN: [id number]  HPI  He is here for refill of his PPI; is essentially asymptomatic. He specifically denies dyspepsia, hoarseness, dysphagia, unexplained weight loss, or melena. He feels that minor rectal bleeding is related to hemorrhoids.  He has a past history of removal of multiple polyps; he is unsure of his last colonoscopy date.  He is on a calcium channel blocker, alpha agent, and a beta blocker for blood pressure and coronary disease. Significantly he had angioedema with an ACE inhibitor; therefore he cannot take an angiotensin receptor blocker.  He has a followup appointment with his cardiologist to discuss his FEF which was initiated at the time of the placement of 2 coronary artery stents.      Review of Systems He is not on a heart healthy diet; he is not exercising. He has decreased his smoking to three fourths pack a day. He denies chest pain, palpitations, dyspnea, or claudication. Family history is positive for coronary disease in his father in his 38s.Because of his CAD LDL goal is less than 70. He has been compliant with his statin which is prescribed by his cardiologist. He denies significant myalgias; he does have some arthralgias. His blood pressure stays above 140/90 despite med compliance. His asthma is essentially quiescent. He uses his rescue agent occasionally for exercise induced symptoms. He feels depression is controlled with present regimen; he is seeing Dr. Tomasa Rand.     Objective:   Physical Exam  Appears healthy and well-nourished & in no acute distress  No oral pharyngeal erythema  No carotid bruits are present.No neck pain distention present at 10 - 15 degrees.   Thyroid normal to palpation  Heart rhythm and rate are normal with no significant murmurs or gallops.S4  Chest is clear with no increased work of breathing. Decreased BS  There is no evidence of aortic  aneurysm or renal artery bruits  Abdomen soft with no organomegaly or masses. No HJR  No clubbing, cyanosis or edema present.  Pedal pulses are intact   No ischemic skin changes are present . Nails healthy    Alert and oriented. Strength, tone, DTRs reflexes normal          Assessment & Plan:  See Current Assessment & Plan in Problem List under specific Diagnosis

## 2013-02-17 NOTE — Assessment & Plan Note (Signed)
Repeat colonoscopy would be indicated once he is off the Effient and and is cleared by his cardiologist

## 2013-02-17 NOTE — Patient Instructions (Addendum)
Please review the medication list in the After Visit Summary provided.Please verify the medication name (this may be  brand or generic) & correct dosage. Write the name of the prescribing physician to the right of the medication and share this with all medical staff seen at each appointment. This will help provide continuity of care; help optimize therapeutic interventions;and help prevent drug:drug adverse reaction. Reflux of gastric acid may be asymptomatic as this may occur mainly during sleep.The triggers for reflux  include stress; the "aspirin family" ; alcohol; peppermint; and caffeine (coffee, tea, cola, and chocolate). The aspirin family would include aspirin and the nonsteroidal agents such as ibuprofen &  Naproxen. Tylenol would not cause reflux. If having symptoms ; food & drink should be avoided for @ least 2 hours before going to bed. Minimal Blood Pressure Goal= AVERAGE < 140/90;  Ideal is an AVERAGE < 135/85. This AVERAGE should be calculated from @ least 5-7 BP readings taken @ different times of day on different days of week. You should not respond to isolated BP readings , but rather the AVERAGE for that week .Please bring your  blood pressure cuff to office visits to verify that it is reliable.It  can also be checked against the blood pressure device at the pharmacy. Finger or wrist cuffs are not dependable; an arm cuff is. As per the Standard of Care , screening Colonoscopy recommended @ 50 & every 5-10 years thereafter  . More frequent monitor would be dictated by types of polyps found  @ Colonoscopy. Please think about quitting smoking. Review the risks we discussed. Please call 1-800-QUIT-NOW (240-800-8429) for free smoking cessation counseling.   If you activate the  My Chart system; lab & Xray results will be released directly  to you as soon as I review & address these through the computer. If you choose not to sign up for My Chart within 36 hours of labs being drawn; results will  be reviewed & interpretation added before being copied & mailed, causing a delay in getting the results to you.If you do not receive that report within 7-10 days ,please call. Additionally you can use this system to gain direct  access to your records  if  out of town or @ an office of a  physician who is not in  the My Chart network.  This improves continuity of care & places you in control of your medical record.  Review and correct the record as indicated. Please share record with all medical staff seen.

## 2013-02-17 NOTE — Assessment & Plan Note (Signed)
Blood pressure control is suboptimal. Goals discussed. Sodium restriction would be recommended along with smoking cessation. These interventions may provide adequate control without adding more medications or increasing doses of present medications.  I asked him to discuss the alpha agent with Dr. Chales Abrahams. Perhaps carvedilol could replace this as well as the metoprolol; but I defer to his cardiologist

## 2013-02-17 NOTE — Assessment & Plan Note (Signed)
PPI will be renewed. Antireflux measures discussed

## 2013-02-17 NOTE — Assessment & Plan Note (Signed)
Lipids are monitored by his cardiologist. He does not have abdominal or significant myalgia symptoms at this time.

## 2013-03-08 ENCOUNTER — Other Ambulatory Visit: Payer: Self-pay | Admitting: Internal Medicine

## 2013-07-06 ENCOUNTER — Other Ambulatory Visit: Payer: Self-pay | Admitting: Internal Medicine

## 2013-07-06 NOTE — Telephone Encounter (Signed)
Ventolin refills sent to pharmacy

## 2013-09-06 ENCOUNTER — Other Ambulatory Visit: Payer: Self-pay | Admitting: Internal Medicine

## 2013-09-07 NOTE — Telephone Encounter (Signed)
Clonidine refilled per protocol 

## 2014-02-23 ENCOUNTER — Other Ambulatory Visit: Payer: Self-pay | Admitting: Internal Medicine

## 2014-03-08 ENCOUNTER — Other Ambulatory Visit: Payer: Self-pay | Admitting: Internal Medicine

## 2014-03-24 ENCOUNTER — Other Ambulatory Visit: Payer: Self-pay | Admitting: Internal Medicine

## 2014-05-17 ENCOUNTER — Other Ambulatory Visit: Payer: Self-pay | Admitting: Internal Medicine

## 2014-06-06 ENCOUNTER — Other Ambulatory Visit: Payer: Self-pay | Admitting: Internal Medicine

## 2014-06-22 ENCOUNTER — Other Ambulatory Visit: Payer: Self-pay | Admitting: Internal Medicine

## 2014-07-08 ENCOUNTER — Ambulatory Visit (INDEPENDENT_AMBULATORY_CARE_PROVIDER_SITE_OTHER): Payer: No Typology Code available for payment source | Admitting: Internal Medicine

## 2014-07-08 DIAGNOSIS — R4689 Other symptoms and signs involving appearance and behavior: Secondary | ICD-10-CM | POA: Insufficient documentation

## 2014-07-08 DIAGNOSIS — Z0289 Encounter for other administrative examinations: Secondary | ICD-10-CM

## 2014-07-08 NOTE — Progress Notes (Signed)
   Subjective:    Patient ID: Samuel Morrow, male    DOB: 09/29/1966, 48 y.o.   MRN: [id number]  HPI    Review of Systems     Objective:   Physical Exam        Assessment & Plan:

## 2014-07-29 ENCOUNTER — Other Ambulatory Visit: Payer: Self-pay | Admitting: Internal Medicine

## 2014-08-15 ENCOUNTER — Other Ambulatory Visit: Payer: Self-pay | Admitting: Internal Medicine

## 2015-03-16 ENCOUNTER — Other Ambulatory Visit: Payer: Self-pay | Admitting: Emergency Medicine

## 2015-03-16 MED ORDER — CLONIDINE HCL 0.1 MG PO TABS: 0.1000 mg | ORAL_TABLET | Freq: Two times a day (BID) | ORAL | Status: DC

## 2015-04-14 ENCOUNTER — Other Ambulatory Visit: Payer: Self-pay | Admitting: Internal Medicine

## 2015-04-14 NOTE — Telephone Encounter (Signed)
No showed in 10/15 He'll need to go to UC for refills

## 2015-04-14 NOTE — Telephone Encounter (Signed)
Patient has scheduled office visit for 7/20

## 2015-04-14 NOTE — Telephone Encounter (Signed)
Last office visit oct/2015---please advise, thanks

## 2015-04-18 ENCOUNTER — Other Ambulatory Visit: Payer: Self-pay | Admitting: Emergency Medicine

## 2015-04-18 MED ORDER — CLONIDINE HCL 0.1 MG PO TABS: 0.1000 mg | ORAL_TABLET | Freq: Two times a day (BID) | ORAL | Status: DC

## 2015-04-19 ENCOUNTER — Ambulatory Visit (INDEPENDENT_AMBULATORY_CARE_PROVIDER_SITE_OTHER): Payer: Commercial Managed Care - PPO | Admitting: Internal Medicine

## 2015-04-19 ENCOUNTER — Other Ambulatory Visit (INDEPENDENT_AMBULATORY_CARE_PROVIDER_SITE_OTHER): Payer: Commercial Managed Care - PPO

## 2015-04-19 ENCOUNTER — Encounter: Payer: Self-pay | Admitting: Internal Medicine

## 2015-04-19 VITALS — BP 140/96 | HR 73 | Temp 97.8°F | Resp 18 | Wt 202.0 lb

## 2015-04-19 DIAGNOSIS — I2510 Atherosclerotic heart disease of native coronary artery without angina pectoris: Secondary | ICD-10-CM | POA: Diagnosis not present

## 2015-04-19 DIAGNOSIS — Z720 Tobacco use: Secondary | ICD-10-CM | POA: Diagnosis not present

## 2015-04-19 DIAGNOSIS — I10 Essential (primary) hypertension: Secondary | ICD-10-CM

## 2015-04-19 DIAGNOSIS — J4530 Mild persistent asthma, uncomplicated: Secondary | ICD-10-CM | POA: Diagnosis not present

## 2015-04-19 DIAGNOSIS — F17200 Nicotine dependence, unspecified, uncomplicated: Secondary | ICD-10-CM

## 2015-04-19 LAB — BASIC METABOLIC PANEL
BUN: 16 mg/dL (ref 6–23)
CALCIUM: 10 mg/dL (ref 8.4–10.5)
CO2: 26 meq/L (ref 19–32)
Chloride: 100 mEq/L (ref 96–112)
Creatinine, Ser: 0.91 mg/dL (ref 0.40–1.50)
GFR: 95.02 mL/min (ref >60.00)
Glucose, Bld: 104 mg/dL — ABNORMAL HIGH (ref 70–99)
POTASSIUM: 4.5 meq/L (ref 3.5–5.1)
Sodium: 134 mEq/L — ABNORMAL LOW (ref 135–145)

## 2015-04-19 NOTE — Progress Notes (Signed)
Pre visit review using our clinic review tool, if applicable. No additional management support is needed unless otherwise documented below in the visit note. 

## 2015-04-19 NOTE — Patient Instructions (Addendum)
Minimal Blood Pressure Goal= AVERAGE < 140/90;  Ideal is an AVERAGE < 135/85. This AVERAGE should be calculated from @ least 5-7 BP readings taken @ different times of day on different days of week. You should not respond to isolated BP readings , but rather the AVERAGE for that week .Please bring your  blood pressure cuff to office visits to verify that it is reliable.It  can also be checked against the blood pressure device at the pharmacy. Finger or wrist cuffs are not dependable; an arm cuff is.Please think about quitting smoking. Review the risks we discussed. Please call 1-800-QUIT-NOW (925) 139-9123) for free smoking cessation counseling. There are multiple options are to help you stop smoking. These include nicotine patches, nicotine gum, and the new "E cigarette".   Albuterol is a rescue inhaler which should be used as infrequently as possible; it should never be used more than 1-2 puffs every 4 hours. It may  be used 15-30 minutes before exercise if that is also  a trigger for your asthma.   If it is required more than 2-3 times per week; the asthma is not well controlled. Breo, Symbicort , Dulera , Advair or other maintenance agent should be considered to control smooth muscle spasm and airway inflammation  and to prevent adverse effects from excess albuterol use.Those adverse effects can include health or life threatening heart rhythm irregularities.     To use Breo: Pull cap down to release medication. Blow out as much as possible then inhale powder as deeply as possible. Hold breath to count of ten then exhale.  Gargle and spit after use. Lot #: [id number] Expiration date: 08/2016  Plain Mucinex (NOT D) for thick secretions ;force NON dairy fluids .   Nasal cleansing in the shower as discussed with lather of mild shampoo.After 10 seconds wash off lather while  exhaling through nostrils. Make sure that all residual soap is removed to prevent irritation.  Fluticasone 1 spray in each  nostril twice a day as needed. Use the "crossover" technique into opposite nostril spraying toward opposite ear @ 45 degree angle, not straight up into nostril.  Use a Neti pot daily only  as needed for significant sinus congestion; going from open side to congested side . Plain Allegra (NOT D )  160 daily , Loratidine 10 mg , OR Zyrtec 10 mg @ bedtime  as needed for itchy eyes & sneezing.

## 2015-04-19 NOTE — Progress Notes (Signed)
   Subjective:    Patient ID: Samuel Morrow, male    DOB: 1966-07-19, 49 y.o.   MRN: [id number]  HPI   He is here for refill of his blood pressure medicines. He is not checking his blood pressures at home. He does eat fried foods and red meat as well as salt. He is not exercising.  He is no longer on Effient is he completed the recommended full course following his 2 stents. He has been prescribed atorvastatin 80 mg daily.  He continues to smoke a pack a day.  He has a occasional exertional dyspnea. He has wheezing and has sputum production typically in the mornings. He is using his rescue  albuterol  Inhaler at least twice a day. He has no maintenance agent.He also has some extrinsic symptoms of watery eyes.  Review of Systems  Chest pain, palpitations, tachycardia, paroxysmal nocturnal dyspnea, claudication or edema are absent.  Frontal headache, facial pain , nasal purulence, dental pain, sore throat , otic pain or otic discharge denied. No fever , chills or sweats.    Objective:   Physical Exam  Pertinent or positive findings include: He is unshaven.He exhibits nasal mucosa & oropharyngeal  erythema. He has diffuse low-grade rhonchi  & musical wheezing in all lung fields  General appearance :adequately nourished; in no distress.  Eyes: No conjunctival inflammation or scleral icterus is present.  Oral exam:  Lips and gums are healthy appearing.There is no oropharyngeal  exudate noted. Dental hygiene is good.  Heart:  Normal rate and regular rhythm. S1 and S2 normal without gallop, murmur, click, rub or other extra sounds     Abdomen: bowel sounds normal, soft and non-tender without masses, organomegaly or hernias noted.  No guarding or rebound.   Vascular : all pulses equal ; no bruits present.  Skin:Warm & dry.  Intact without suspicious lesions or rashes ; no tenting or jaundice   Lymphatic: No lymphadenopathy is noted about the head, neck, axilla.   Neuro: Strength,  tone & DTRs normal.        Assessment & Plan:  #1 hypertension, suboptimal control  #2 persistent asthma in the context of one pack per day smoking  #3 coronary artery disease  Risks and options were discussed with him.

## 2015-04-25 ENCOUNTER — Other Ambulatory Visit: Payer: Self-pay | Admitting: Internal Medicine

## 2015-04-26 ENCOUNTER — Other Ambulatory Visit: Payer: Self-pay | Admitting: Emergency Medicine

## 2015-04-26 MED ORDER — CLONIDINE HCL 0.1 MG PO TABS: 0.1000 mg | ORAL_TABLET | Freq: Two times a day (BID) | ORAL | Status: DC

## 2015-12-19 ENCOUNTER — Other Ambulatory Visit: Payer: Self-pay | Admitting: Internal Medicine

## 2016-01-15 ENCOUNTER — Other Ambulatory Visit: Payer: Self-pay | Admitting: Internal Medicine

## 2016-03-20 ENCOUNTER — Encounter: Payer: Self-pay | Admitting: Emergency Medicine

## 2016-03-20 ENCOUNTER — Ambulatory Visit (INDEPENDENT_AMBULATORY_CARE_PROVIDER_SITE_OTHER): Payer: Commercial Managed Care - PPO | Admitting: Internal Medicine

## 2016-03-20 ENCOUNTER — Encounter: Payer: Self-pay | Admitting: Internal Medicine

## 2016-03-20 VITALS — BP 154/92 | HR 75 | Temp 98.3°F | Resp 16 | Wt 200.0 lb

## 2016-03-20 DIAGNOSIS — I10 Essential (primary) hypertension: Secondary | ICD-10-CM

## 2016-03-20 DIAGNOSIS — R1013 Epigastric pain: Secondary | ICD-10-CM

## 2016-03-20 DIAGNOSIS — F329 Major depressive disorder, single episode, unspecified: Secondary | ICD-10-CM

## 2016-03-20 DIAGNOSIS — F32A Depression, unspecified: Secondary | ICD-10-CM

## 2016-03-20 DIAGNOSIS — J4530 Mild persistent asthma, uncomplicated: Secondary | ICD-10-CM

## 2016-03-20 DIAGNOSIS — I2510 Atherosclerotic heart disease of native coronary artery without angina pectoris: Secondary | ICD-10-CM | POA: Diagnosis not present

## 2016-03-20 DIAGNOSIS — Z720 Tobacco use: Secondary | ICD-10-CM

## 2016-03-20 DIAGNOSIS — D3A025 Benign carcinoid tumor of the sigmoid colon: Secondary | ICD-10-CM

## 2016-03-20 DIAGNOSIS — K219 Gastro-esophageal reflux disease without esophagitis: Secondary | ICD-10-CM

## 2016-03-20 DIAGNOSIS — E785 Hyperlipidemia, unspecified: Secondary | ICD-10-CM

## 2016-03-20 DIAGNOSIS — F17200 Nicotine dependence, unspecified, uncomplicated: Secondary | ICD-10-CM

## 2016-03-20 DIAGNOSIS — F1010 Alcohol abuse, uncomplicated: Secondary | ICD-10-CM | POA: Insufficient documentation

## 2016-03-20 MED ORDER — AMLODIPINE BESYLATE 5 MG PO TABS: 5.0000 mg | ORAL_TABLET | Freq: Every day | ORAL | Status: AC

## 2016-03-20 MED ORDER — OMEPRAZOLE 40 MG PO CPDR: 40.0000 mg | DELAYED_RELEASE_CAPSULE | Freq: Every day | ORAL | Status: AC

## 2016-03-20 MED ORDER — METOPROLOL TARTRATE 50 MG PO TABS: 50.0000 mg | ORAL_TABLET | Freq: Two times a day (BID) | ORAL | Status: AC

## 2016-03-20 MED ORDER — ATORVASTATIN CALCIUM 80 MG PO TABS: 80.0000 mg | ORAL_TABLET | Freq: Every day | ORAL | Status: DC

## 2016-03-20 MED ORDER — CLONIDINE HCL 0.1 MG PO TABS: 0.1000 mg | ORAL_TABLET | Freq: Two times a day (BID) | ORAL | Status: DC

## 2016-03-20 NOTE — Assessment & Plan Note (Signed)
Check lipids Taking lipitor 80 mg

## 2016-03-20 NOTE — Progress Notes (Signed)
Pre visit review using our clinic review tool, if applicable. No additional management support is needed unless otherwise documented below in the visit note. 

## 2016-03-20 NOTE — Patient Instructions (Addendum)
Have blood work done soon first thing in the morning.  Test(s) ordered today. Your results will be released to [place] (or called to you) after review, usually within 72hours after test completion. If any changes need to be made, you will be notified at that same time.  All other Health Maintenance issues reviewed.   All recommended immunizations and age-appropriate screenings are up-to-date or discussed.  No immunizations administered today.  Think about getting a heaptitis A/B vaccine.   Medications reviewed and updated.  Changes include increasing the omeprazole to 40mg  daily and starting amlodipine 5 mg daily for your blood pressure.   Your prescription(s) have been submitted to your pharmacy. Please take as directed and contact our office if you believe you are having problem(s) with the medication(s).  A referral was ordered for cardiology.   Please followup in 3 months to recheck your blood pressure

## 2016-03-20 NOTE — Progress Notes (Signed)
Subjective:    Patient ID: Samuel Morrow, male    DOB: 02/27/1966, 50 y.o.   MRN: [id number]  HPI He is here to establish with a new pcp.   He is here for follow up.  CAD, Hypertension: He is taking his medication daily. He is not compliant with a low sodium diet.   He is not  exercising regularly.  He does not monitor his blood pressure at home.  He has not seen cardiology in a while.  He does have some chest pain - it is in different spots in his chest. He has palpitations at times and thinks it is related to anxiety.    Hyperlipidemia: He is taking his medication daily. He is not compliant with a low fat/cholesterol diet. He is not exercising regularly. He denies myalgias.   GERD:  He is taking his medication daily as prescribed.  He denies any GERD symptoms and feels his GERD is well controlled.   Stomach pain:  It started two days ago.  He was drinking a lot of alcohol over the weekend and just snacking.  He had pain in his upper abdomen near his rib cage and it moved around.  It lasted all day.  He had a little this morning.  It is worse with eating.  He has had PUD in the past and it feels like that.  Yesterday he did have some tarry stool, but he did take pepto-bismol.  Today his stool was normal.     Medications and allergies reviewed with patient and updated if appropriate.  Patient Active Problem List   Diagnosis Date Noted  . Abdominal pain, epigastric 03/20/2016  . Alcohol abuse 03/20/2016  . Coronary atherosclerosis of native coronary artery 02/17/2013  . Benign neoplasm of colon 02/17/2013  . Current smoker 09/27/2011  . Essential hypertension 10/24/2009  . 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 ERECTILE DYSFUNCTION 10/27/2008  . Depression 10/27/2008  . Hyperlipidemia 12/14/2007  . ESOPHAGEAL REFLUX 12/14/2007  . 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 Asthma 11/04/2006    Current Outpatient Prescriptions on  File Prior to Visit  Medication Sig Dispense Refill  . aspirin 81 MG EC tablet Take 81 mg by mouth daily. Swallow whole.     . lamoTRIgine (LAMICTAL) 100 MG tablet Take 100 mg by mouth daily.      Marland Kitchen venlafaxine (EFFEXOR-XR) 150 MG 24 hr capsule Take 150 mg by mouth daily.      . VENTOLIN HFA 108 (90 BASE) MCG/ACT inhaler INHALE 2 PUFFS BY MOUTH EVERY 4 HOURS AS NEEDED FOR SHORTNESS OF BREATH OR WHEEZING 18 g 5   No current facility-administered medications on file prior to visit.    Past Medical History  Diagnosis Date  . Carcinoid tumor 2003  . Airway compromise     age 51 due to shrimp ingestion  . History of chest pain     due to esophageal spasm  . Asthma   . GERD (gastroesophageal reflux disease)   . 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 ED (erectile dysfunction)     since ruptured disc 11/2007  . Incomplete right bundle branch block   . Anisocoria 2007    Left pupil larger than the right  . Hypertension   . Hyperlipidemia     LDL goal less than 100    Past Surgical History  Procedure Laterality Date  . Inguinal hernia repair    . Carcinoid tumor  2003    Status post resection  . Epidural steroid injections      Degenerative disc disease  . Rca stent  07/18/2011    Dr Elonda Husky, [facility]  . Colonoscopy with polypectomy      [facility]    Social History   Social History  . Marital Status: Married    Spouse Name: N/A  . Number of Children: N/A  . Years of Education: N/A   Social History Main Topics  . Smoking status: Current Every Day Smoker -- 0.25 packs/day    Types: Cigarettes  . Smokeless tobacco: None  . Alcohol Use: 0.0 oz/week    6-8 Cans of beer per week     Comment: 8 beers daily, worse on weekends  . Drug Use: No  . Sexual Activity: Not Asked   Other Topics Concern  . None   Social History Narrative    Family History  Problem Relation Age of Onset  . Heart attack Father 52    smoker  . Depression Father   . Hypertension Father   . Alcohol abuse  Paternal Grandfather   . Heart attack Paternal Grandfather     Exact age unknown  . Alcohol abuse Maternal Grandfather   . Heart attack Maternal Grandfather     Exact age unknown    Review of Systems  Constitutional: Positive for appetite change (decreased). Negative for unexpected weight change.       Getting hot/cold for a few nights  HENT: Positive for congestion and postnasal drip.   Respiratory: Positive for shortness of breath and wheezing (? allergies). Negative for cough.   Cardiovascular: Positive for chest pain (if different spots) and palpitations (? anxiety). Negative for leg swelling.  Gastrointestinal: Positive for abdominal pain (upper abdomen) and diarrhea. Negative for blood in stool.       No gerd  Musculoskeletal: Negative for myalgias.  Neurological: Negative for dizziness, light-headedness and headaches.  Psychiatric/Behavioral: Positive for dysphoric mood.       Objective:   Filed Vitals:   03/20/16 1304  BP: 154/92  Pulse: 75  Temp: 98.3 F (36.8 C)  Resp: 16   Filed Weights   03/20/16 1304  Weight: 200 lb (90.719 kg)   Body mass index is 26.39 kg/(m^2).   Physical Exam Constitutional: Appears well-developed and well-nourished. No distress.  Neck: b/l ear canals and TM normal.  Neck supple. No tracheal deviation present. No thyromegaly present.  No carotid bruit. No cervical adenopathy.   Cardiovascular: Normal rate, regular rhythm and normal heart sounds.   No murmur heard.  No edema Pulmonary/Chest: Effort normal and breath sounds normal. No respiratory distress. No wheezes.  Abdomen: soft, nontender, non distended, no mass       Assessment & Plan:   See Problem List for Assessment and Plan of chronic medical problems.  F/u in 3 months

## 2016-03-20 NOTE — Assessment & Plan Note (Signed)
Not controlled Restart amlodipine but at 5mg  daily Continue other medications Stressed low sodium diet, smoking cessation, alcohol abstinence cmp

## 2016-03-20 NOTE — Assessment & Plan Note (Addendum)
Follows with psych Stressed alcohol abstinence Concern for cirrhosis - [place] ordered Discussed hep A/B vaccines - he will think about it

## 2016-03-20 NOTE — Assessment & Plan Note (Signed)
Has not seen cardio in a while - having atypical chest pain Will refer back to cardio Discussed importance of smoking and alcohol cessation continue current meds Check labs

## 2016-03-20 NOTE — Assessment & Plan Note (Signed)
Gastritis, PUD, GERD [place] of abdomen Check labs Stressed smoking cessation, alcohol abstinence Increased omeprazole to 40 mg daily May need to see GI

## 2016-03-20 NOTE — Assessment & Plan Note (Signed)
Uses albuterol as needed - continue smoking cessation

## 2016-03-20 NOTE — Assessment & Plan Note (Signed)
Stressed smoking cessation 

## 2016-03-20 NOTE — Assessment & Plan Note (Signed)
Not controlled Taking omeprazole 20 mg daily -- increase to 40 mg daily Amy need to see GI if symptoms do not improve Stressed smoking cessation, alcohol cessation

## 2016-03-20 NOTE — Assessment & Plan Note (Signed)
Management per psychiatry Taking lamictal and effexor

## 2016-03-21 ENCOUNTER — Encounter: Payer: Self-pay | Admitting: Emergency Medicine

## 2016-03-21 ENCOUNTER — Other Ambulatory Visit (INDEPENDENT_AMBULATORY_CARE_PROVIDER_SITE_OTHER): Payer: Commercial Managed Care - PPO

## 2016-03-21 DIAGNOSIS — D3A025 Benign carcinoid tumor of the sigmoid colon: Secondary | ICD-10-CM

## 2016-03-21 DIAGNOSIS — J4530 Mild persistent asthma, uncomplicated: Secondary | ICD-10-CM | POA: Diagnosis not present

## 2016-03-21 DIAGNOSIS — I10 Essential (primary) hypertension: Secondary | ICD-10-CM

## 2016-03-21 DIAGNOSIS — K219 Gastro-esophageal reflux disease without esophagitis: Secondary | ICD-10-CM

## 2016-03-21 DIAGNOSIS — R1013 Epigastric pain: Secondary | ICD-10-CM

## 2016-03-21 DIAGNOSIS — F32A Depression, unspecified: Secondary | ICD-10-CM

## 2016-03-21 DIAGNOSIS — E785 Hyperlipidemia, unspecified: Secondary | ICD-10-CM

## 2016-03-21 DIAGNOSIS — I2510 Atherosclerotic heart disease of native coronary artery without angina pectoris: Secondary | ICD-10-CM

## 2016-03-21 DIAGNOSIS — Z720 Tobacco use: Secondary | ICD-10-CM

## 2016-03-21 DIAGNOSIS — F1010 Alcohol abuse, uncomplicated: Secondary | ICD-10-CM

## 2016-03-21 DIAGNOSIS — F17200 Nicotine dependence, unspecified, uncomplicated: Secondary | ICD-10-CM

## 2016-03-21 DIAGNOSIS — F329 Major depressive disorder, single episode, unspecified: Secondary | ICD-10-CM

## 2016-03-21 LAB — CBC WITH DIFFERENTIAL/PLATELET
BASOS ABS: 0.1 10*3/uL (ref 0.0–0.1)
BASOS PCT: 0.9 % (ref 0.0–3.0)
EOS ABS: 0.7 10*3/uL (ref 0.0–0.7)
Eosinophils Relative: 6.7 % — ABNORMAL HIGH (ref 0.0–5.0)
HEMATOCRIT: 37.1 % — AB (ref 39.0–52.0)
HEMOGLOBIN: 12.1 g/dL — AB (ref 13.0–17.0)
LYMPHS PCT: 15.2 % (ref 12.0–46.0)
Lymphs Abs: 1.5 10*3/uL (ref 0.7–4.0)
MCHC: 32.8 g/dL (ref 30.0–36.0)
MCV: 76.1 fl — ABNORMAL LOW (ref 78.0–100.0)
MONO ABS: 0.9 10*3/uL (ref 0.1–1.0)
Monocytes Relative: 9.3 % (ref 3.0–12.0)
Neutro Abs: 6.8 10*3/uL (ref 1.4–7.7)
Neutrophils Relative %: 67.9 % (ref 43.0–77.0)
Platelets: 314 10*3/uL (ref 150.0–400.0)
RBC: 4.87 Mil/uL (ref 4.22–5.81)
RDW: 20.1 % — AB (ref 11.5–15.5)
WBC: 10 10*3/uL (ref 4.0–10.5)

## 2016-03-21 LAB — COMPREHENSIVE METABOLIC PANEL
ALBUMIN: 4.3 g/dL (ref 3.5–5.2)
ALK PHOS: 112 U/L (ref 39–117)
ALT: 27 U/L (ref 0–53)
AST: 26 U/L (ref 0–37)
BILIRUBIN TOTAL: 0.4 mg/dL (ref 0.2–1.2)
BUN: 6 mg/dL (ref 6–23)
CALCIUM: 9.8 mg/dL (ref 8.4–10.5)
CO2: 26 mEq/L (ref 19–32)
CREATININE: 0.87 mg/dL (ref 0.40–1.50)
Chloride: 102 mEq/L (ref 96–112)
GFR: 99.69 mL/min (ref >60.00)
Glucose, Bld: 83 mg/dL (ref 70–99)
Potassium: 4 mEq/L (ref 3.5–5.1)
SODIUM: 137 meq/L (ref 135–145)
TOTAL PROTEIN: 8.3 g/dL (ref 6.0–8.3)

## 2016-03-21 LAB — LIPID PANEL
CHOLESTEROL: 153 mg/dL (ref 0–200)
HDL: 46.3 mg/dL (ref >39.00)
LDL Cholesterol: 89 mg/dL (ref 0–99)
NONHDL: 106.31
Total CHOL/HDL Ratio: 3
Triglycerides: 85 mg/dL (ref 0.0–149.0)
VLDL: 17 mg/dL (ref 0.0–40.0)

## 2016-03-21 LAB — TSH: TSH: 1.64 u[IU]/mL (ref 0.35–4.50)

## 2016-03-21 LAB — HEMOGLOBIN A1C: HEMOGLOBIN A1C: 5.7 % (ref 4.6–6.5)

## 2016-03-28 ENCOUNTER — Ambulatory Visit
Admission: RE | Admit: 2016-03-28 | Discharge: 2016-03-28 | Disposition: A | Discharge status: Home / Self Care | Payer: Commercial Managed Care - PPO | Source: Ambulatory Visit | Attending: Internal Medicine | Admitting: Internal Medicine

## 2016-03-28 DIAGNOSIS — R1013 Epigastric pain: Secondary | ICD-10-CM

## 2016-05-13 ENCOUNTER — Telehealth: Payer: Self-pay | Admitting: Internal Medicine

## 2016-05-13 DIAGNOSIS — Z3009 Encounter for other general counseling and advice on contraception: Secondary | ICD-10-CM

## 2016-05-13 NOTE — Telephone Encounter (Signed)
No need for visit-referral ordered

## 2016-05-13 NOTE — Telephone Encounter (Signed)
Please advise 

## 2016-05-13 NOTE — Telephone Encounter (Signed)
Patient is interested in a vasectomy.  Does patient need to schedule appt with Dr. Quay Burow first or can he just be referred?

## 2016-05-14 NOTE — Telephone Encounter (Signed)
Spoke with pt to inform.  

## 2016-06-06 ENCOUNTER — Other Ambulatory Visit: Payer: Self-pay | Admitting: Internal Medicine

## 2016-06-19 ENCOUNTER — Ambulatory Visit: Payer: Commercial Managed Care - PPO | Admitting: Internal Medicine

## 2016-06-19 NOTE — Progress Notes (Deleted)
Subjective:    Patient ID: Samuel Morrow, male    DOB: 1966-04-16, 50 y.o.   MRN: [id number]  HPI He is here for follow up.  Hypertension: He is taking his medication daily. He is compliant with a low sodium diet.  He denies chest pain, palpitations, edema, shortness of breath and regular headaches. He is exercising regularly.  He does not monitor his blood pressure at home.    GERD:  He is taking his medication daily as prescribed.  He denies any GERD symptoms and feels his GERD is well controlled.    Medications and allergies reviewed with patient and updated if appropriate.  Patient Active Problem List   Diagnosis Date Noted  . Abdominal pain, epigastric 03/20/2016  . Alcohol abuse 03/20/2016  . Coronary atherosclerosis of native coronary artery 02/17/2013  . Benign neoplasm of colon 02/17/2013  . Current smoker 09/27/2011  . Essential hypertension 10/24/2009  . 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 ERECTILE DYSFUNCTION 10/27/2008  . Depression 10/27/2008  . Hyperlipidemia 12/14/2007  . ESOPHAGEAL REFLUX 12/14/2007  . 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 Asthma 11/04/2006    Current Outpatient Prescriptions on File Prior to Visit  Medication Sig Dispense Refill  . amLODipine (NORVASC) 5 MG tablet Take 1 tablet (5 mg total) by mouth daily. 90 tablet 3  . aspirin 81 MG EC tablet Take 81 mg by mouth daily. Swallow whole.     Marland Kitchen. atorvastatin (LIPITOR) 80 MG tablet Take 1 tablet (80 mg total) by mouth daily. 90 tablet 1  . cloNIDine (CATAPRES) 0.1 MG tablet Take 1 tablet (0.1 mg total) by mouth 2 (two) times daily. 180 tablet 1  . lamoTRIgine (LAMICTAL) 100 MG tablet Take 100 mg by mouth daily.      . metoprolol (LOPRESSOR) 50 MG tablet Take 1 tablet (50 mg total) by mouth 2 (two) times daily. 180 tablet 3  . omeprazole (PRILOSEC) 40 MG capsule Take 1 capsule (40 mg total) by mouth daily. 90 capsule 3  . venlafaxine  (EFFEXOR-XR) 150 MG 24 hr capsule Take 150 mg by mouth daily.      . VENTOLIN HFA 108 (90 Base) MCG/ACT inhaler INHALE 2 PUFFS BY MOUTH EVERY 4 HOURS AS NEEDED FOR SHORTNESS OF BREATH OR WHEEZING 18 g 2   No current facility-administered medications on file prior to visit.     Past Medical History:  Diagnosis Date  . Airway compromise    age 50 due to shrimp ingestion  . Anisocoria 2007   Left pupil larger than the right  . Asthma   . Carcinoid tumor 2003  . ED (erectile dysfunction)    since ruptured disc 11/2007  . Elevated homocysteine (HCC)   . GERD (gastroesophageal reflux disease)   . History of chest pain    due to esophageal spasm  . Hyperlipidemia    LDL goal less than 100  . Hypertension   . Incomplete right bundle branch block     Past Surgical History:  Procedure Laterality Date  . Carcinoid tumor  2003   Status post resection  . colonoscopy with polypectomy     [facility]  . Epidural steroid injections     Degenerative disc disease  . INGUINAL HERNIA REPAIR    . RCA stent  07/18/2011   [facility]    Social History   Social History  . Marital status: Married    Spouse name: N/A  .  Number of children: N/A  . Years of education: N/A   Social History Main Topics  . Smoking status: Current Every Day Smoker    Packs/day: 0.25    Types: Cigarettes  . Smokeless tobacco: Not on file  . Alcohol use 0.0 oz/week    6 - 8 Cans of beer per week     Comment: 8 beers daily, worse on weekends  . Drug use: No  . Sexual activity: Not on file   Other Topics Concern  . Not on file   Social History Narrative  . No narrative on file    Family History  Problem Relation Age of Onset  . Heart attack Father 31    smoker  . Depression Father   . Hypertension Father   . Alcohol abuse Paternal Grandfather   . Heart attack Paternal Grandfather     Exact age unknown  . Alcohol abuse Maternal Grandfather   . Heart attack Maternal Grandfather     Exact age  unknown    Review of Systems     Objective:  There were no vitals filed for this visit. There were no vitals filed for this visit. There is no height or weight on file to calculate BMI.   Physical Exam Constitutional: Appears well-developed and well-nourished. No distress.  HENT:  Head: Normocephalic and atraumatic.  Neck: Neck supple. No tracheal deviation present. No thyromegaly present.  Cardiovascular: Normal rate, regular rhythm and normal heart sounds.   No murmur heard. No carotid bruit  Pulmonary/Chest: Effort normal and breath sounds normal. No respiratory distress. No has no wheezes. No rales.  Musculoskeletal: No edema.  Lymphadenopathy: No cervical adenopathy.  Skin: Skin is warm and dry. Not diaphoretic.  Psychiatric: Normal mood and affect. Behavior is normal.         Assessment & Plan:   See Problem List for Assessment and Plan of chronic medical problems.

## 2016-09-26 ENCOUNTER — Other Ambulatory Visit: Payer: Self-pay | Admitting: Internal Medicine

## 2016-11-19 ENCOUNTER — Other Ambulatory Visit: Payer: Self-pay | Admitting: Internal Medicine

## 2017-01-20 ENCOUNTER — Other Ambulatory Visit: Payer: Self-pay | Admitting: Internal Medicine

## 2017-02-06 ENCOUNTER — Telehealth: Payer: Self-pay | Admitting: Internal Medicine

## 2017-02-07 ENCOUNTER — Telehealth: Payer: Self-pay | Admitting: Internal Medicine

## 2017-02-07 NOTE — Telephone Encounter (Signed)
The pts Dad called asking to speak with Dr Quay Burow. He said that the pt (his son) had a heart attack and passed away last night. He said that he is going to have to borrow $12,000 to be able to burry him. He will have to get the money to the funeral home by Friday. For him to be able to do this, he will need the death certificate signed and to the funeral home by Tuesday. They are working with [employer] home and one of their employees Scientist, forensic) is going to be bringing the death certificate to be signed on Monday morning. He would like to wait in the office for it to be signed so that it can be delivered to the funeral home as soon as possible. They are trying to get this taken care of so that he can file with his lawyer to be able to become the administrator of the estate and be able to get the money to burry him. Dr Quay Burow would not necessarily need to call the pts father back unless she feels that she needs to.

## 2017-02-07 NOTE — Telephone Encounter (Signed)
Pt is deceased. 

## 2017-02-07 NOTE — Telephone Encounter (Signed)
I did receive call from ED doctor last night with notification

## 2017-02-10 ENCOUNTER — Telehealth: Payer: Self-pay

## 2017-02-10 NOTE — Telephone Encounter (Signed)
On Feb 22, 2017 I received a death certificate from [employer] (original). The death certificate is for burial. The patient is a patient of Doctor Billey Gosling. The death certificate will be taken to [facility] this am for signature.  On 02/22/17 I received the death certificate back from Doctor Burns. I got the death certificate ready and called the funeral home to let them know the death certificate is ready for pickup.

## 2017-02-10 NOTE — Telephone Encounter (Signed)
Brought in today - completed

## 2017-02-28 NOTE — Telephone Encounter (Signed)
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  He was unable to be resuscitated and was pronounced after 30 minutes.  Cause of death inferior MI.

## 2017-02-28 DEATH — deceased

## 2017-09-16 IMAGING — US US ABDOMEN COMPLETE
1 series · 14 of 25 positions shown · non-contrast
Comparison: None.

CLINICAL DATA: Epigastric abdominal pain.

EXAM:
ABDOMEN ULTRASOUND COMPLETE

[Series 1: us abdomen complete · 0.32mm/px · 14 of 77 slices shown]
[im 1/77]
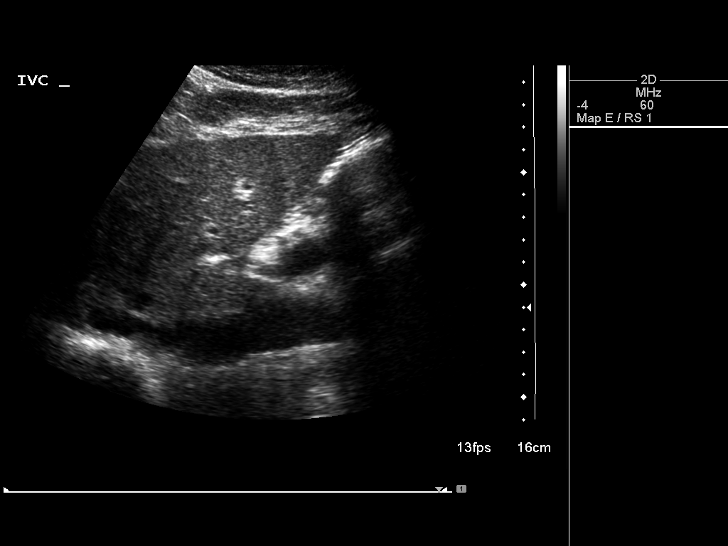
[im 7/77]
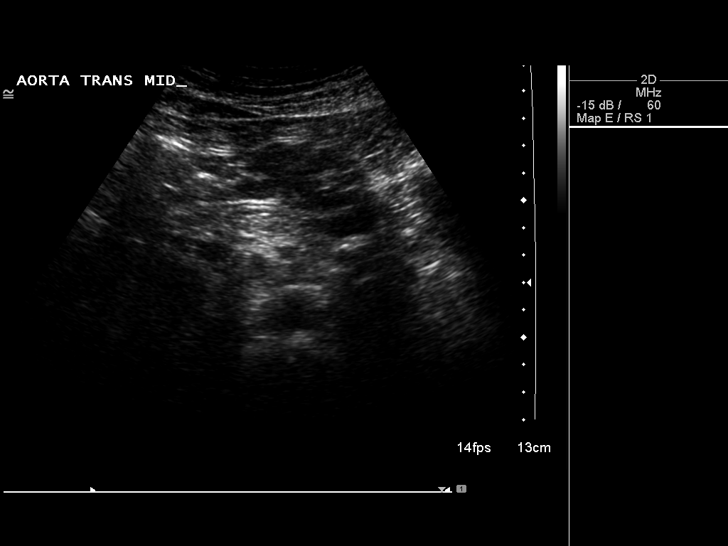
[im 13/77]
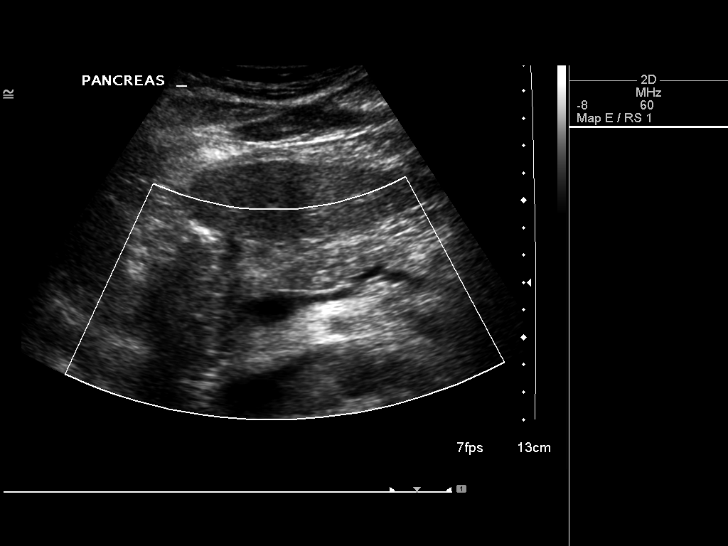
[im 20/77]
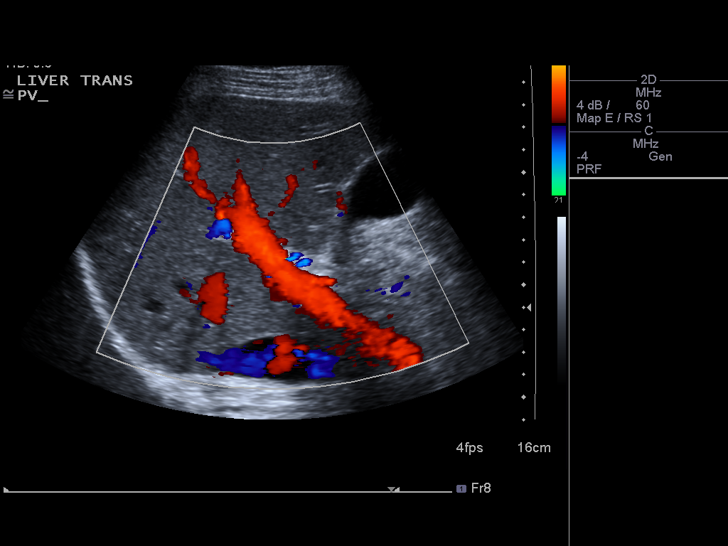
[im 26/77]
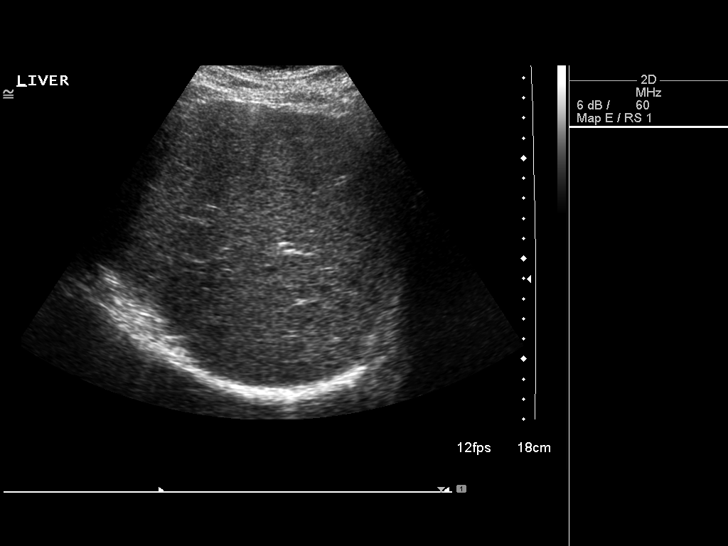
[im 29/77]
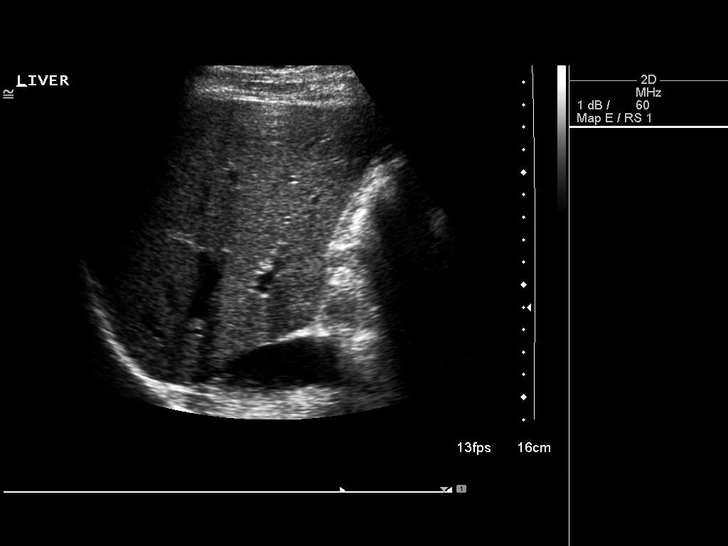
[im 35/77]
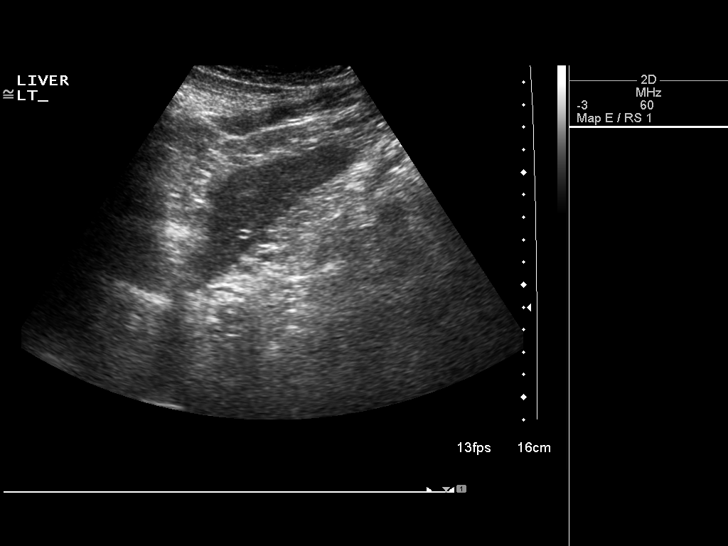
[im 42/77]
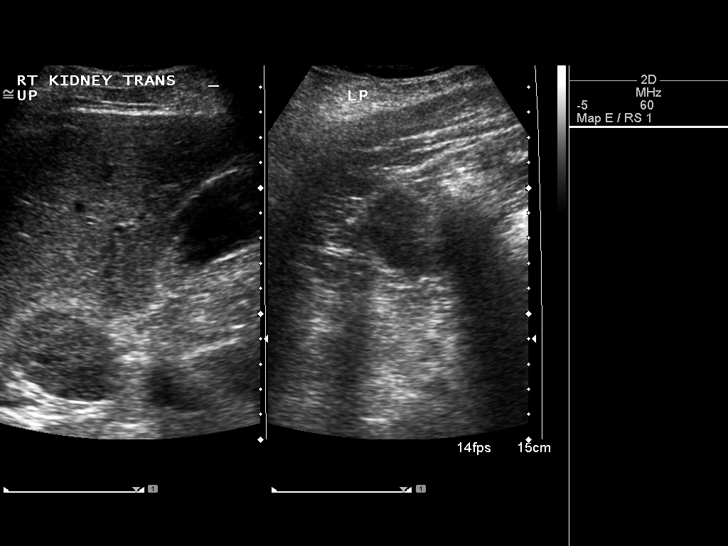
[im 48/77]
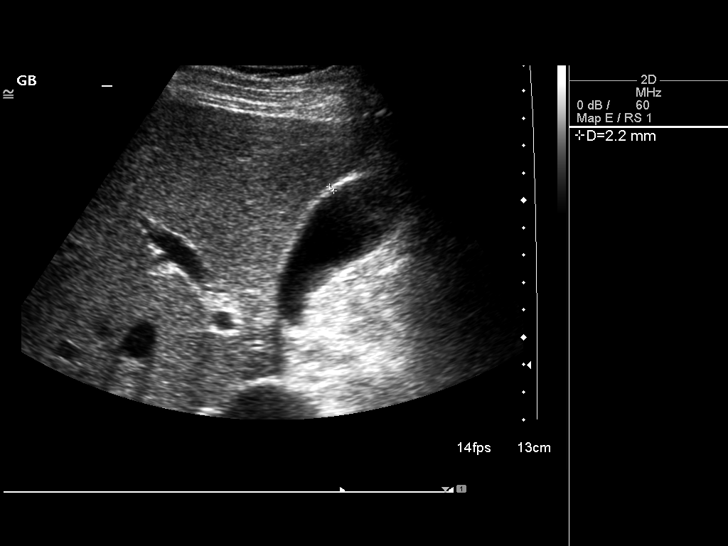
[im 51/77]
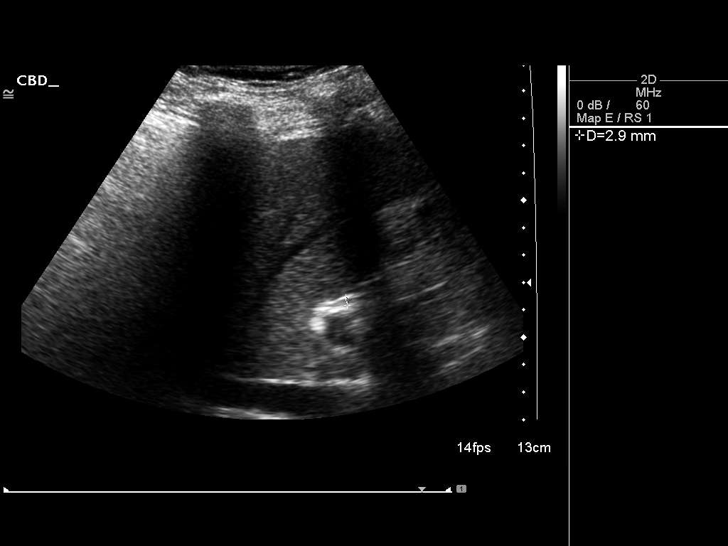
[im 58/77]
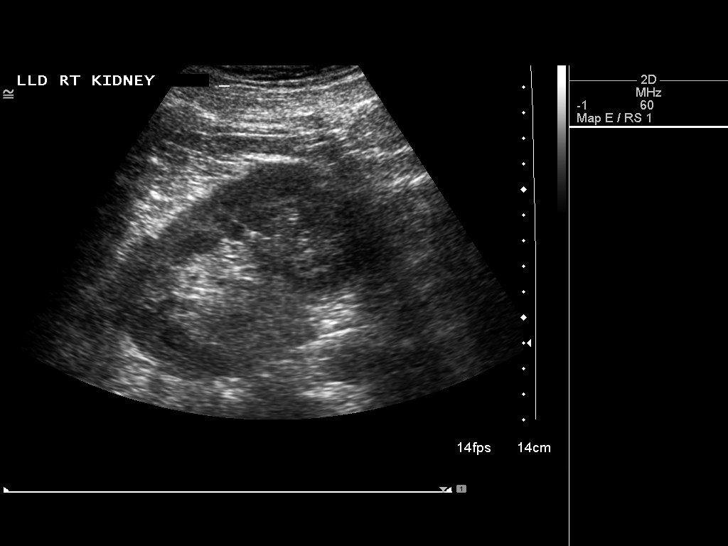
[im 64/77]
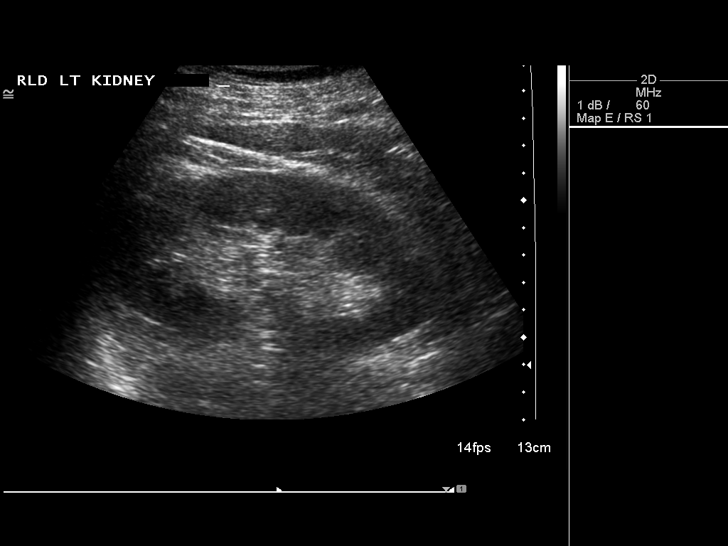
[im 70/77]
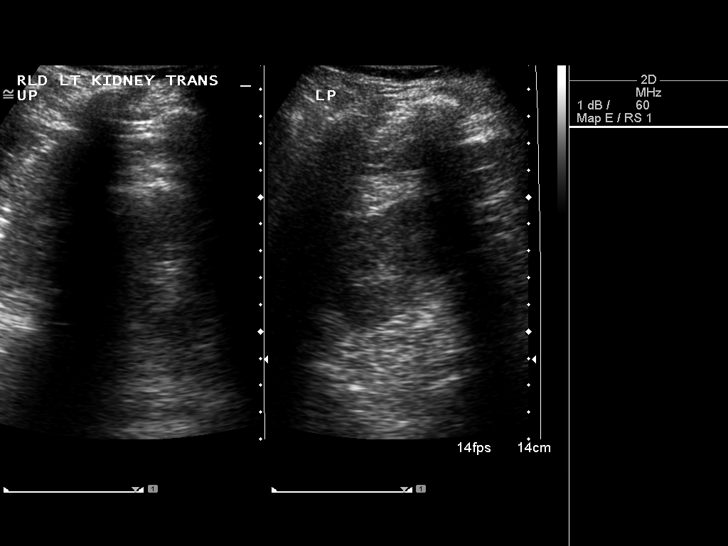
[im 77/77]
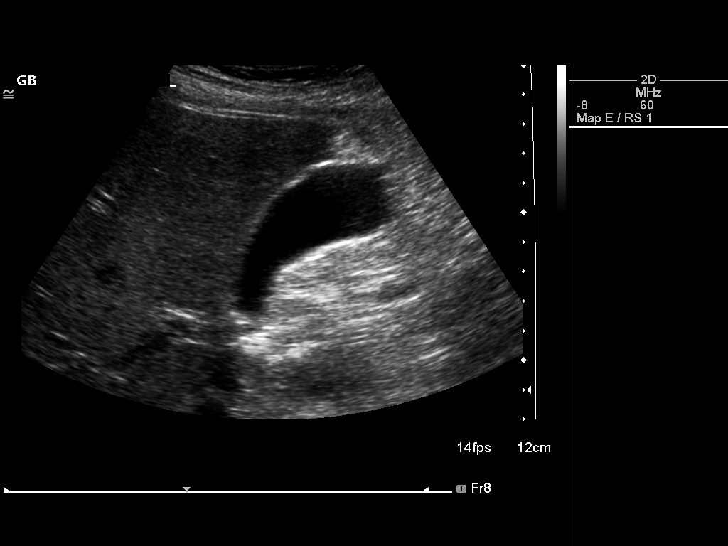

[14 of 25 positions shown; findings below may reference images not displayed]

FINDINGS: Gallbladder: No gallstones or wall thickening visualized. No
sonographic Murphy sign noted by sonographer.

Common bile duct: Diameter: 2.9 mm which is within normal limits.

Liver: No focal lesion identified. Within normal limits in
parenchymal echogenicity.

IVC: No abnormality visualized.

Pancreas: Visualized portion unremarkable.

Spleen: Size and appearance within normal limits.

Right Kidney: Length: 12 cm. Echogenicity within normal limits. No
mass or hydronephrosis visualized.

Left Kidney: Length: 13.3 cm. Echogenicity within normal limits. No
mass or hydronephrosis visualized.

Abdominal aorta: No aneurysm visualized.

Other findings: None.
IMPRESSION: No definite abnormality seen in the abdomen.
